# Patient Record
Sex: Male | Born: 1970 | Race: White | Hispanic: No | Marital: Married | Smoking: Never smoker
Health system: Southern US, Community
[De-identification: ages and names within clinical notes are randomized; demographics above are authoritative.]

## PROBLEM LIST (undated history)

## (undated) DIAGNOSIS — K209 Esophagitis, unspecified without bleeding: Secondary | ICD-10-CM

## (undated) DIAGNOSIS — Z947 Corneal transplant status: Secondary | ICD-10-CM

## (undated) DIAGNOSIS — S61432A Puncture wound without foreign body of left hand, initial encounter: Secondary | ICD-10-CM

## (undated) DIAGNOSIS — H4052X4 Glaucoma secondary to other eye disorders, left eye, indeterminate stage: Secondary | ICD-10-CM

## (undated) DIAGNOSIS — W3400XA Accidental discharge from unspecified firearms or gun, initial encounter: Secondary | ICD-10-CM

## (undated) DIAGNOSIS — B9681 Helicobacter pylori [H. pylori] as the cause of diseases classified elsewhere: Secondary | ICD-10-CM

## (undated) DIAGNOSIS — K2970 Gastritis, unspecified, without bleeding: Secondary | ICD-10-CM

## (undated) DIAGNOSIS — K259 Gastric ulcer, unspecified as acute or chronic, without hemorrhage or perforation: Secondary | ICD-10-CM

## (undated) HISTORY — PX: TRABECULECTOMY: SHX107

## (undated) HISTORY — PX: ELBOW SURGERY: SHX618

## (undated) HISTORY — PX: CATARACT EXTRACTION: SUR2

## (undated) HISTORY — PX: COLONOSCOPY: SHX174

## (undated) HISTORY — PX: OTHER SURGICAL HISTORY: SHX169

## (undated) HISTORY — PX: CORNEAL TRANSPLANT: SHX108

---

## 2004-11-25 ENCOUNTER — Ambulatory Visit: Payer: Self-pay | Admitting: Unknown Physician Specialty

## 2004-12-02 ENCOUNTER — Ambulatory Visit: Payer: Self-pay | Admitting: Unknown Physician Specialty

## 2004-12-14 ENCOUNTER — Emergency Department: Payer: Self-pay | Admitting: Emergency Medicine

## 2006-07-08 ENCOUNTER — Encounter: Admission: RE | Admit: 2006-07-08 | Discharge: 2006-07-08 | Payer: Self-pay | Admitting: Specialist

## 2007-07-02 ENCOUNTER — Emergency Department: Payer: Self-pay | Admitting: Internal Medicine

## 2007-07-03 ENCOUNTER — Ambulatory Visit: Payer: Self-pay | Admitting: Internal Medicine

## 2007-07-04 ENCOUNTER — Ambulatory Visit: Payer: Self-pay | Admitting: Internal Medicine

## 2007-07-14 ENCOUNTER — Ambulatory Visit: Payer: Self-pay | Admitting: Gastroenterology

## 2007-07-29 ENCOUNTER — Emergency Department (HOSPITAL_COMMUNITY): Admission: EM | Admit: 2007-07-29 | Discharge: 2007-07-29 | Payer: Self-pay | Admitting: Emergency Medicine

## 2007-08-07 ENCOUNTER — Encounter: Admission: RE | Admit: 2007-08-07 | Discharge: 2007-08-07 | Payer: Self-pay | Admitting: Specialist

## 2007-10-26 ENCOUNTER — Emergency Department (HOSPITAL_COMMUNITY): Admission: EM | Admit: 2007-10-26 | Discharge: 2007-10-26 | Payer: Self-pay | Admitting: Emergency Medicine

## 2008-08-11 ENCOUNTER — Ambulatory Visit: Payer: Self-pay | Admitting: Family Medicine
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
## 2011-01-09 ENCOUNTER — Emergency Department (HOSPITAL_COMMUNITY)
Admission: EM | Admit: 2011-01-09 | Discharge: 2011-01-09 | Disposition: A | Discharge status: Home / Self Care | Payer: 59 | Attending: Emergency Medicine | Admitting: Emergency Medicine

## 2011-01-09 ENCOUNTER — Emergency Department (HOSPITAL_COMMUNITY): Payer: 59

## 2011-01-09 DIAGNOSIS — S93409A Sprain of unspecified ligament of unspecified ankle, initial encounter: Secondary | ICD-10-CM | POA: Insufficient documentation

## 2011-01-09 DIAGNOSIS — S81009A Unspecified open wound, unspecified knee, initial encounter: Secondary | ICD-10-CM | POA: Insufficient documentation

## 2011-01-09 DIAGNOSIS — S81809A Unspecified open wound, unspecified lower leg, initial encounter: Secondary | ICD-10-CM | POA: Insufficient documentation

## 2011-01-09 DIAGNOSIS — Z23 Encounter for immunization: Secondary | ICD-10-CM | POA: Insufficient documentation

## 2011-01-09 DIAGNOSIS — M7989 Other specified soft tissue disorders: Secondary | ICD-10-CM | POA: Insufficient documentation

## 2011-01-09 DIAGNOSIS — W1789XA Other fall from one level to another, initial encounter: Secondary | ICD-10-CM | POA: Insufficient documentation

## 2011-01-09 DIAGNOSIS — M79609 Pain in unspecified limb: Secondary | ICD-10-CM | POA: Insufficient documentation

## 2011-01-09 DIAGNOSIS — M25579 Pain in unspecified ankle and joints of unspecified foot: Secondary | ICD-10-CM | POA: Insufficient documentation

## 2011-01-09 LAB — POCT I-STAT, CHEM 8
BUN: 17 mg/dL (ref 6–23)
Creatinine, Ser: 1.2 mg/dL (ref 0.4–1.5)
Glucose, Bld: 107 mg/dL — ABNORMAL HIGH (ref 70–99)
Hemoglobin: 16 g/dL (ref 13.0–17.0)
Sodium: 138 mEq/L (ref 135–145)
TCO2: 23 mmol/L (ref 0–100)

## 2011-01-09 LAB — CBC
HCT: 43.1 % (ref 39.0–52.0)
Hemoglobin: 14.8 g/dL (ref 13.0–17.0)
MCH: 32.3 pg (ref 26.0–34.0)
MCHC: 34.3 g/dL (ref 30.0–36.0)
MCV: 94.1 fL (ref 78.0–100.0)
RBC: 4.58 MIL/uL (ref 4.22–5.81)

## 2011-01-09 LAB — DIFFERENTIAL
Lymphocytes Relative: 14 % (ref 12–46)
Lymphs Abs: 1.4 10*3/uL (ref 0.7–4.0)
Monocytes Absolute: 0.8 10*3/uL (ref 0.1–1.0)
Monocytes Relative: 8 % (ref 3–12)
Neutro Abs: 7.7 10*3/uL (ref 1.7–7.7)
Neutrophils Relative %: 78 % — ABNORMAL HIGH (ref 43–77)

## 2011-01-12 ENCOUNTER — Inpatient Hospital Stay (HOSPITAL_COMMUNITY)
Admission: AD | Admit: 2011-01-12 | Discharge: 2011-01-14 | DRG: 563 | Disposition: A | Discharge status: Home / Self Care | Payer: 59 | Source: Ambulatory Visit | Attending: Orthopedic Surgery | Admitting: Orthopedic Surgery

## 2011-01-12 DIAGNOSIS — M7989 Other specified soft tissue disorders: Secondary | ICD-10-CM | POA: Diagnosis present

## 2011-01-12 DIAGNOSIS — Y92009 Unspecified place in unspecified non-institutional (private) residence as the place of occurrence of the external cause: Secondary | ICD-10-CM

## 2011-01-12 DIAGNOSIS — S96819A Strain of other specified muscles and tendons at ankle and foot level, unspecified foot, initial encounter: Principal | ICD-10-CM | POA: Diagnosis present

## 2011-01-12 DIAGNOSIS — S93499A Sprain of other ligament of unspecified ankle, initial encounter: Principal | ICD-10-CM | POA: Diagnosis present

## 2011-01-12 DIAGNOSIS — T798XXA Other early complications of trauma, initial encounter: Secondary | ICD-10-CM | POA: Diagnosis present

## 2011-01-12 DIAGNOSIS — X58XXXA Exposure to other specified factors, initial encounter: Secondary | ICD-10-CM

## 2011-01-12 LAB — BASIC METABOLIC PANEL
BUN: 16 mg/dL (ref 6–23)
CO2: 25 mEq/L (ref 19–32)
Calcium: 8.4 mg/dL (ref 8.4–10.5)
Chloride: 103 mEq/L (ref 96–112)
Creatinine, Ser: 0.93 mg/dL (ref 0.4–1.5)
GFR calc Af Amer: >60 mL/min (ref >60)

## 2011-01-12 LAB — CBC
Hemoglobin: 13.3 g/dL (ref 13.0–17.0)
MCH: 31.4 pg (ref 26.0–34.0)
MCHC: 33.2 g/dL (ref 30.0–36.0)
MCV: 94.6 fL (ref 78.0–100.0)
Platelets: 191 10*3/uL (ref 150–400)
RBC: 4.24 MIL/uL (ref 4.22–5.81)

## 2011-01-13 ENCOUNTER — Inpatient Hospital Stay (HOSPITAL_COMMUNITY): Payer: 59

## 2011-01-14 LAB — CBC
MCH: 31.3 pg (ref 26.0–34.0)
MCV: 95.3 fL (ref 78.0–100.0)
Platelets: 210 10*3/uL (ref 150–400)
RBC: 3.86 MIL/uL — ABNORMAL LOW (ref 4.22–5.81)
RDW: 13.3 % (ref 11.5–15.5)

## 2011-01-14 LAB — BASIC METABOLIC PANEL
BUN: 16 mg/dL (ref 6–23)
Calcium: 8.7 mg/dL (ref 8.4–10.5)
Creatinine, Ser: 1.03 mg/dL (ref 0.4–1.5)
GFR calc Af Amer: >60 mL/min (ref >60)
GFR calc non Af Amer: >60 mL/min (ref >60)
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
## 2012-05-16 DIAGNOSIS — R490 Dysphonia: Secondary | ICD-10-CM | POA: Insufficient documentation

## 2012-05-31 DIAGNOSIS — H4050X0 Glaucoma secondary to other eye disorders, unspecified eye, stage unspecified: Secondary | ICD-10-CM | POA: Insufficient documentation

## 2012-05-31 DIAGNOSIS — Z947 Corneal transplant status: Secondary | ICD-10-CM | POA: Insufficient documentation

## 2014-06-11 DIAGNOSIS — N400 Enlarged prostate without lower urinary tract symptoms: Secondary | ICD-10-CM | POA: Insufficient documentation

## 2014-06-11 DIAGNOSIS — N411 Chronic prostatitis: Secondary | ICD-10-CM | POA: Insufficient documentation

## 2014-06-11 DIAGNOSIS — E291 Testicular hypofunction: Secondary | ICD-10-CM | POA: Insufficient documentation

## 2015-12-30 DIAGNOSIS — Z79899 Other long term (current) drug therapy: Secondary | ICD-10-CM | POA: Insufficient documentation

## 2016-02-18 ENCOUNTER — Emergency Department
Admission: EM | Admit: 2016-02-18 | Discharge: 2016-02-18 | Disposition: A | Discharge status: Home / Self Care | Payer: BLUE CROSS/BLUE SHIELD | Attending: Emergency Medicine | Admitting: Emergency Medicine

## 2016-02-18 ENCOUNTER — Emergency Department: Payer: BLUE CROSS/BLUE SHIELD

## 2016-02-18 ENCOUNTER — Encounter: Payer: Self-pay | Admitting: Emergency Medicine

## 2016-02-18 DIAGNOSIS — Y9389 Activity, other specified: Secondary | ICD-10-CM | POA: Insufficient documentation

## 2016-02-18 DIAGNOSIS — W3400XA Accidental discharge from unspecified firearms or gun, initial encounter: Secondary | ICD-10-CM | POA: Diagnosis not present

## 2016-02-18 DIAGNOSIS — R52 Pain, unspecified: Secondary | ICD-10-CM

## 2016-02-18 DIAGNOSIS — Y929 Unspecified place or not applicable: Secondary | ICD-10-CM | POA: Diagnosis not present

## 2016-02-18 DIAGNOSIS — S61402A Unspecified open wound of left hand, initial encounter: Secondary | ICD-10-CM | POA: Diagnosis present

## 2016-02-18 DIAGNOSIS — Y999 Unspecified external cause status: Secondary | ICD-10-CM | POA: Diagnosis not present

## 2016-02-18 MED ORDER — OXYCODONE-ACETAMINOPHEN 5-325 MG PO TABS
ORAL_TABLET | ORAL | Status: AC
  Administered 2016-02-18: 2 via ORAL
  Filled 2016-02-18: qty 2

## 2016-02-18 MED ORDER — HYDROMORPHONE HCL 1 MG/ML IJ SOLN
1.0000 mg | Freq: Once | INTRAMUSCULAR | Status: AC
  Administered 2016-02-18: 1 mg via INTRAVENOUS
  Filled 2016-02-18: qty 1

## 2016-02-18 MED ORDER — CEPHALEXIN 500 MG PO CAPS
500.0000 mg | ORAL_CAPSULE | Freq: Once | ORAL | Status: AC
  Administered 2016-02-18: 500 mg via ORAL
  Filled 2016-02-18: qty 1

## 2016-02-18 MED ORDER — LIDOCAINE-EPINEPHRINE (PF) 1 %-1:200000 IJ SOLN
30.0000 mL | Freq: Once | INTRAMUSCULAR | Status: AC
  Administered 2016-02-18: 30 mL
  Filled 2016-02-18: qty 30

## 2016-02-18 MED ORDER — MORPHINE SULFATE (PF) 4 MG/ML IV SOLN
4.0000 mg | Freq: Once | INTRAVENOUS | Status: AC
  Administered 2016-02-18: 4 mg via INTRAMUSCULAR

## 2016-02-18 MED ORDER — OXYCODONE-ACETAMINOPHEN 5-325 MG PO TABS
2.0000 | ORAL_TABLET | Freq: Once | ORAL | Status: AC
  Administered 2016-02-18: 2 via ORAL

## 2016-02-18 MED ORDER — MORPHINE SULFATE (PF) 4 MG/ML IV SOLN
INTRAVENOUS | Status: AC
  Administered 2016-02-18: 4 mg via INTRAMUSCULAR
  Filled 2016-02-18: qty 1

## 2016-02-18 MED ORDER — LIDOCAINE-EPINEPHRINE-TETRACAINE (LET) SOLUTION
NASAL | Status: AC
  Administered 2016-02-18: 3 mL via TOPICAL
  Filled 2016-02-18: qty 3

## 2016-02-18 MED ORDER — LORAZEPAM 2 MG/ML IJ SOLN: 1.0000 mg | Freq: Once | INTRAMUSCULAR | Status: DC

## 2016-02-18 MED ORDER — LIDOCAINE-EPINEPHRINE-TETRACAINE (LET) SOLUTION
3.0000 mL | Freq: Once | NASAL | Status: AC
  Administered 2016-02-18: 3 mL via TOPICAL

## 2016-02-18 MED ORDER — KETOROLAC TROMETHAMINE 30 MG/ML IJ SOLN
30.0000 mg | Freq: Once | INTRAMUSCULAR | Status: AC
  Administered 2016-02-18: 30 mg via INTRAVENOUS
  Filled 2016-02-18: qty 1

## 2016-02-18 MED ORDER — CEPHALEXIN 500 MG PO CAPS
500.0000 mg | ORAL_CAPSULE | Freq: Three times a day (TID) | ORAL | Status: DC
Start: 2016-02-18 — End: 2016-07-06

## 2016-02-18 MED ORDER — OXYCODONE-ACETAMINOPHEN 5-325 MG PO TABS: 1.0000 | ORAL_TABLET | Freq: Four times a day (QID) | ORAL | Status: DC | PRN
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
## 2016-05-19 DIAGNOSIS — J383 Other diseases of vocal cords: Secondary | ICD-10-CM | POA: Insufficient documentation

## 2016-07-06 ENCOUNTER — Encounter (HOSPITAL_COMMUNITY): Payer: Self-pay | Admitting: Emergency Medicine

## 2016-07-06 ENCOUNTER — Emergency Department (HOSPITAL_COMMUNITY): Payer: BLUE CROSS/BLUE SHIELD

## 2016-07-06 ENCOUNTER — Emergency Department (HOSPITAL_COMMUNITY)
Admission: EM | Admit: 2016-07-06 | Discharge: 2016-07-06 | Disposition: A | Discharge status: Home / Self Care | Payer: BLUE CROSS/BLUE SHIELD | Attending: Emergency Medicine | Admitting: Emergency Medicine

## 2016-07-06 DIAGNOSIS — R1011 Right upper quadrant pain: Secondary | ICD-10-CM | POA: Diagnosis present

## 2016-07-06 LAB — URINALYSIS, ROUTINE W REFLEX MICROSCOPIC
BILIRUBIN URINE: NEGATIVE
GLUCOSE, UA: NEGATIVE mg/dL
HGB URINE DIPSTICK: NEGATIVE
Ketones, ur: NEGATIVE mg/dL
Leukocytes, UA: NEGATIVE
Nitrite: NEGATIVE
PH: 6 (ref 5.0–8.0)
Protein, ur: NEGATIVE mg/dL
SPECIFIC GRAVITY, URINE: 1.026 (ref 1.005–1.030)

## 2016-07-06 LAB — COMPREHENSIVE METABOLIC PANEL
ALT: 46 U/L (ref 17–63)
AST: 32 U/L (ref 15–41)
Albumin: 3.9 g/dL (ref 3.5–5.0)
Alkaline Phosphatase: 37 U/L — ABNORMAL LOW (ref 38–126)
Anion gap: 10 (ref 5–15)
BUN: 20 mg/dL (ref 6–20)
CHLORIDE: 104 mmol/L (ref 101–111)
CO2: 26 mmol/L (ref 22–32)
CREATININE: 1.2 mg/dL (ref 0.61–1.24)
Calcium: 9.5 mg/dL (ref 8.9–10.3)
GFR calc Af Amer: >60 mL/min (ref >60)
GFR calc non Af Amer: >60 mL/min (ref >60)
Glucose, Bld: 96 mg/dL (ref 65–99)
Potassium: 3.6 mmol/L (ref 3.5–5.1)
SODIUM: 140 mmol/L (ref 135–145)
Total Bilirubin: 0.7 mg/dL (ref 0.3–1.2)
Total Protein: 7 g/dL (ref 6.5–8.1)

## 2016-07-06 LAB — CBC WITH DIFFERENTIAL/PLATELET
BASOS ABS: 0 10*3/uL (ref 0.0–0.1)
Basophils Relative: 0 %
EOS ABS: 0.1 10*3/uL (ref 0.0–0.7)
EOS PCT: 1 %
HCT: 45.8 % (ref 39.0–52.0)
HEMOGLOBIN: 16.2 g/dL (ref 13.0–17.0)
LYMPHS PCT: 38 %
Lymphs Abs: 2.6 10*3/uL (ref 0.7–4.0)
MCH: 33 pg (ref 26.0–34.0)
MCHC: 35.4 g/dL (ref 30.0–36.0)
MCV: 93.3 fL (ref 78.0–100.0)
Monocytes Absolute: 0.7 10*3/uL (ref 0.1–1.0)
Monocytes Relative: 11 %
NEUTROS PCT: 50 %
Neutro Abs: 3.4 10*3/uL (ref 1.7–7.7)
PLATELETS: 146 10*3/uL — AB (ref 150–400)
RBC: 4.91 MIL/uL (ref 4.22–5.81)
RDW: 13.9 % (ref 11.5–15.5)
WBC: 6.9 10*3/uL (ref 4.0–10.5)

## 2016-07-06 LAB — LIPASE, BLOOD: Lipase: 46 U/L (ref 11–51)

## 2016-07-06 MED ORDER — SUCRALFATE 1 GM/10ML PO SUSP: 1.0000 g | Freq: Three times a day (TID) | ORAL | 0 refills | Status: DC

## 2016-07-06 MED ORDER — ONDANSETRON HCL 4 MG/2ML IJ SOLN
4.0000 mg | Freq: Once | INTRAMUSCULAR | Status: AC
  Administered 2016-07-06: 4 mg via INTRAVENOUS
  Filled 2016-07-06: qty 2

## 2016-07-06 MED ORDER — PANTOPRAZOLE SODIUM 20 MG PO TBEC: 20.0000 mg | DELAYED_RELEASE_TABLET | Freq: Two times a day (BID) | ORAL | 0 refills | Status: DC

## 2016-07-06 MED ORDER — SODIUM CHLORIDE 0.9 % IV BOLUS (SEPSIS)
1000.0000 mL | Freq: Once | INTRAVENOUS | Status: AC
  Administered 2016-07-06: 1000 mL via INTRAVENOUS

## 2016-07-06 MED ORDER — AMOXICILLIN 500 MG PO TABS: 1000.0000 mg | ORAL_TABLET | Freq: Two times a day (BID) | ORAL | 0 refills | Status: DC

## 2016-07-06 MED ORDER — GI COCKTAIL ~~LOC~~
30.0000 mL | Freq: Once | ORAL | Status: AC
  Administered 2016-07-06: 30 mL via ORAL
  Filled 2016-07-06: qty 30

## 2016-07-06 MED ORDER — POLYETHYLENE GLYCOL 3350 17 G PO PACK: 17.0000 g | PACK | Freq: Every day | ORAL | 0 refills | Status: DC

## 2016-07-06 MED ORDER — HYDROCODONE-ACETAMINOPHEN 5-325 MG PO TABS: 2.0000 | ORAL_TABLET | ORAL | 0 refills | Status: DC | PRN

## 2016-07-06 MED ORDER — CLARITHROMYCIN 500 MG PO TABS: 500.0000 mg | ORAL_TABLET | Freq: Two times a day (BID) | ORAL | 0 refills | Status: DC

## 2016-07-06 MED ORDER — HYDROMORPHONE HCL 2 MG/ML IJ SOLN
1.0000 mg | Freq: Once | INTRAMUSCULAR | Status: AC
  Administered 2016-07-06: 1 mg via INTRAVENOUS
  Filled 2016-07-06: qty 1

## 2016-07-06 NOTE — ED Provider Notes (Signed)
[facility]-EMERGENCY DEPT Provider Note   CSN: [id number] Arrival date & time: 07/06/16  0400     History   Chief Complaint Chief Complaint  Patient presents with  . Abdominal Pain    HPI Eddie Clark is a 45 y.o. male.  Patient presents with complaints of abdominal pain. Reports the pain began 4 or 5 days ago and has progressively worsened. Pain is primarily around the right upper abdomen, but has become more diffuse over time. No associated vomiting or diarrhea. He reports a history of duodenal ulcer secondary to H. pylori, however, reports that this pain seems to be worse. Pain is severe and constant, no alleviating or exacerbating factors.      History reviewed. No pertinent past medical history.  There are no active problems to display for this patient.   History reviewed. No pertinent surgical history.     Home Medications    Prior to Admission medications   Medication Sig Start Date End Date Taking? Authorizing Provider  clomiPHENE (CLOMID) 50 MG tablet Take 25 mg by mouth daily.   Yes Historical Provider, MD  esomeprazole (NEXIUM) 40 MG capsule Take 40 mg by mouth daily at 12 noon.   Yes Historical Provider, MD  naproxen sodium (ANAPROX) 220 MG tablet Take 220 mg by mouth 2 (two) times daily as needed (pain).   Yes Historical Provider, MD  prednisoLONE acetate (PRED FORTE) 1 % ophthalmic suspension Place 1 drop into the left eye daily.   Yes Historical Provider, MD    Family History No family history on file.  Social History Social History  Substance Use Topics  . Smoking status: Never Smoker  . Smokeless tobacco: Never Used  . Alcohol use Not on file     Allergies   Shellfish allergy   Review of Systems Review of Systems  Gastrointestinal: Positive for abdominal pain.  All other systems reviewed and are negative.    Physical Exam Updated Vital Signs BP 104/69   Pulse 66   Temp 98 F (36.7 C) (Oral)   Resp 18   Ht 5\' 9"  (1.753 m)   Wt 275  lb (124.7 kg)   SpO2 96%   BMI 40.61 kg/m   Physical Exam  Constitutional: He is oriented to person, place, and time. He appears well-developed and well-nourished. No distress.  HENT:  Head: Normocephalic and atraumatic.  Right Ear: Hearing normal.  Left Ear: Hearing normal.  Nose: Nose normal.  Mouth/Throat: Oropharynx is clear and moist and mucous membranes are normal.  Eyes: Conjunctivae and EOM are normal. Pupils are equal, round, and reactive to light.  Neck: Normal range of motion. Neck supple.  Cardiovascular: Regular rhythm, S1 normal and S2 normal.  Exam reveals no gallop and no friction rub.   No murmur heard. Pulmonary/Chest: Effort normal and breath sounds normal. No respiratory distress. He exhibits no tenderness.  Abdominal: Soft. Normal appearance and bowel sounds are normal. There is no hepatosplenomegaly. There is tenderness in the right upper quadrant. There is no rebound, no guarding, no tenderness at McBurney's point and negative Murphy's sign. No hernia.  Musculoskeletal: Normal range of motion.  Neurological: He is alert and oriented to person, place, and time. He has normal strength. No cranial nerve deficit or sensory deficit. Coordination normal. GCS eye subscore is 4. GCS verbal subscore is 5. GCS motor subscore is 6.  Skin: Skin is warm, dry and intact. No rash noted. No cyanosis.  Psychiatric: He has a normal mood and affect. His  speech is normal and behavior is normal. Thought content normal.  Nursing note and vitals reviewed.    ED Treatments / Results  Labs (all labs ordered are listed, but only abnormal results are displayed) Labs Reviewed  CBC WITH DIFFERENTIAL/PLATELET - Abnormal; Notable for the following:       Result Value   Platelets 146 (*)    All other components within normal limits  COMPREHENSIVE METABOLIC PANEL - Abnormal; Notable for the following:    Alkaline Phosphatase 37 (*)    All other components within normal limits  URINALYSIS,  ROUTINE W REFLEX MICROSCOPIC (NOT AT [facility])  LIPASE, BLOOD  H. PYLORI ANTIBODY, IGG    EKG  EKG Interpretation None       Radiology Koreas Abdomen Limited Ruq  Result Date: 07/06/2016 CLINICAL DATA:  Sharp upper abdominal pain EXAM: US ABDOMEN LIMITED - RIGHT UPPER QUADRANT COMPARISON:  None. FINDINGS: Gallbladder: No gallstones or wall thickening visualized. No sonographic Murphy sign noted by sonographer. Common bile duct: Diameter: 3 mm Liver: Increased hepatic echogenicity eat, consistent with fatty infiltration. IMPRESSION: 1. No acute abnormality of the right upper quadrant. No evidence of cholecystitis. 2. Hyperechoic hepatic echotexture, suggesting hepatic steatosis. Electronically Signed   By: Deatra RobinsonKevin  Herman M.D.   On: 07/06/2016 05:06    Procedures Procedures (including critical care time)  Medications Ordered in ED Medications  gi cocktail (Maalox,Lidocaine,Donnatal) (not administered)  HYDROmorphone (DILAUDID) injection 1 mg (not administered)  sodium chloride 0.9 % bolus 1,000 mL (1,000 mLs Intravenous New Bag/Given 07/06/16 0434)  ondansetron (ZOFRAN) injection 4 mg (4 mg Intravenous Given 07/06/16 0432)  HYDROmorphone (DILAUDID) injection 1 mg (1 mg Intravenous Given 07/06/16 0432)     Initial Impression / Assessment and Plan / ED Course  I have reviewed the triage vital signs and the nursing notes.  Pertinent labs & imaging results that were available during my care of the patient were reviewed by me and considered in my medical decision making (see chart for details).  Clinical Course    Patient presents with complaints of upper abdominal pain, predominantly right-sided. Examination did reveal right upper quadrant tenderness but there is no obvious Murphy sign. No guarding or rebound. Left upper quadrant, left lower quadrant, right lower quadrant unremarkable and nontender. Lab work is normal. Patient underwent right upper quadrant ultrasound and there is no acute  abnormality noted.  Symptoms are consistent with possible recurrence of duodenal ulcer. Will treat empirically for H. pylori, symptomatic treatment.  Final Clinical Impressions(s) / ED Diagnoses   Final diagnoses:  Right upper quadrant abdominal pain    New Prescriptions New Prescriptions   No medications on file     Gilda Creasehristopher J Pollina, MD 07/06/16 [phone]

## 2016-07-06 NOTE — ED Triage Notes (Addendum)
Pt in from home with non-radiating sharp, stabbing R sided abd pain x 5 days. Pt denies n/v/d, sob or cp. States he had shrimp alfredo accidentally 5 days ago, has shellfish allergy, had vomitting but no other effects. States he's had normal BM's, abd distention present. Hx of duodenal ulcer

## 2016-07-06 NOTE — ED Notes (Signed)
Pt taken to xray 

## 2016-07-06 NOTE — ED Notes (Signed)
Pt taken to US

## 2016-07-07 LAB — H. PYLORI ANTIBODY, IGG

## 2016-07-09 ENCOUNTER — Encounter: Payer: Self-pay | Admitting: *Deleted

## 2016-07-09 ENCOUNTER — Ambulatory Visit
Admission: RE | Admit: 2016-07-09 | Discharge: 2016-07-09 | Disposition: A | Discharge status: Home / Self Care | Payer: BLUE CROSS/BLUE SHIELD | Source: Ambulatory Visit | Attending: Gastroenterology | Admitting: Gastroenterology

## 2016-07-09 ENCOUNTER — Encounter
Admission: RE | Disposition: A | Discharge status: Home / Self Care | Payer: Self-pay | Source: Ambulatory Visit | Attending: Gastroenterology

## 2016-07-09 ENCOUNTER — Ambulatory Visit: Payer: BLUE CROSS/BLUE SHIELD | Admitting: Anesthesiology

## 2016-07-09 DIAGNOSIS — Z79899 Other long term (current) drug therapy: Secondary | ICD-10-CM | POA: Diagnosis not present

## 2016-07-09 DIAGNOSIS — K2950 Unspecified chronic gastritis without bleeding: Secondary | ICD-10-CM | POA: Insufficient documentation

## 2016-07-09 DIAGNOSIS — R1011 Right upper quadrant pain: Secondary | ICD-10-CM | POA: Diagnosis not present

## 2016-07-09 DIAGNOSIS — K210 Gastro-esophageal reflux disease with esophagitis: Secondary | ICD-10-CM | POA: Insufficient documentation

## 2016-07-09 DIAGNOSIS — Z8719 Personal history of other diseases of the digestive system: Secondary | ICD-10-CM | POA: Insufficient documentation

## 2016-07-09 HISTORY — DX: Gastritis, unspecified, without bleeding: K29.70

## 2016-07-09 HISTORY — DX: Accidental discharge from unspecified firearms or gun, initial encounter: W34.00XA

## 2016-07-09 HISTORY — DX: Puncture wound without foreign body of left hand, initial encounter: S61.432A

## 2016-07-09 HISTORY — DX: Esophagitis, unspecified: K20.9

## 2016-07-09 HISTORY — DX: Esophagitis, unspecified without bleeding: K20.90

## 2016-07-09 HISTORY — PX: ESOPHAGOGASTRODUODENOSCOPY (EGD) WITH PROPOFOL: SHX5813

## 2016-07-09 HISTORY — DX: Helicobacter pylori (H. pylori) as the cause of diseases classified elsewhere: B96.81

## 2016-07-09 HISTORY — DX: Gastric ulcer, unspecified as acute or chronic, without hemorrhage or perforation: K25.9

## 2016-07-09 SURGERY — ESOPHAGOGASTRODUODENOSCOPY (EGD) WITH PROPOFOL
Anesthesia: General

## 2016-07-09 MED ORDER — SODIUM CHLORIDE 0.9 % IV SOLN: INTRAVENOUS | Status: DC

## 2016-07-09 MED ORDER — PROPOFOL 10 MG/ML IV BOLUS
INTRAVENOUS | Status: DC | PRN
  Administered 2016-07-09 (×2): 30 mg via INTRAVENOUS
  Administered 2016-07-09: 20 mg via INTRAVENOUS
  Administered 2016-07-09: 70 mg via INTRAVENOUS
  Administered 2016-07-09: 50 mg via INTRAVENOUS

## 2016-07-09 MED ORDER — SODIUM CHLORIDE 0.9 % IV SOLN
INTRAVENOUS | Status: DC
  Administered 2016-07-09: 15:00:00 via INTRAVENOUS
  Administered 2016-07-09: 1000 mL via INTRAVENOUS

## 2016-07-09 MED ORDER — MIDAZOLAM HCL 2 MG/2ML IJ SOLN
INTRAMUSCULAR | Status: DC | PRN
  Administered 2016-07-09: 2 mg via INTRAVENOUS

## 2016-07-09 MED ORDER — GLYCOPYRROLATE 0.2 MG/ML IJ SOLN
INTRAMUSCULAR | Status: DC | PRN
  Administered 2016-07-09: 0.2 mg via INTRAVENOUS

## 2016-07-09 MED ORDER — LIDOCAINE HCL (CARDIAC) 20 MG/ML IV SOLN
INTRAVENOUS | Status: DC | PRN
  Administered 2016-07-09: 60 mg via INTRAVENOUS

## 2016-07-09 NOTE — Anesthesia Postprocedure Evaluation (Signed)
Anesthesia Post Note  Patient: Eddie Clark  Procedure(s) Performed: Procedure(s) (LRB): ESOPHAGOGASTRODUODENOSCOPY (EGD) WITH PROPOFOL (N/A)  Patient location during evaluation: Endoscopy Anesthesia Type: General Level of consciousness: awake and alert Pain management: pain level controlled Vital Signs Assessment: post-procedure vital signs reviewed and stable Respiratory status: spontaneous breathing, nonlabored ventilation, respiratory function stable and patient connected to nasal cannula oxygen Cardiovascular status: blood pressure returned to baseline and stable Postop Assessment: no signs of nausea or vomiting Anesthetic complications: no    Last Vitals:  Vitals:   07/09/16 1552 07/09/16 1602  BP: 123/73 118/86  Pulse: 74 82  Resp: 16 16  Temp:      Last Pain:  Vitals:   07/09/16 1532  TempSrc: Tympanic  PainSc:                  Cleda MccreedyJoseph K Desiray Orchard

## 2016-07-09 NOTE — Anesthesia Preprocedure Evaluation (Signed)
Anesthesia Evaluation  Patient identified by MRN, date of birth, ID band Patient awake    Reviewed: Allergy & Precautions, NPO status , Patient's Chart, lab work & pertinent test results  History of Anesthesia Complications Negative for: history of anesthetic complications  Airway Mallampati: II       Dental   Pulmonary neg pulmonary ROS,   Pt with raspy voice 2ndary to vocal cord polyp removed several weeks ago.         Cardiovascular      Neuro/Psych negative neurological ROS     GI/Hepatic Neg liver ROS, PUD, GERD  Medicated and Controlled,  Endo/Other  negative endocrine ROS  Renal/GU negative Renal ROS     Musculoskeletal   Abdominal   Peds  Hematology negative hematology ROS (+)   Anesthesia Other Findings   Reproductive/Obstetrics                             Anesthesia Physical Anesthesia Plan  ASA: II  Anesthesia Plan: General   Post-op Pain Management:    Induction: Intravenous  Airway Management Planned: Nasal Cannula  Additional Equipment:   Intra-op Plan:   Post-operative Plan:   Informed Consent: I have reviewed the patients History and Physical, chart, labs and discussed the procedure including the risks, benefits and alternatives for the proposed anesthesia with the patient or authorized representative who has indicated his/her understanding and acceptance.     Plan Discussed with:   Anesthesia Plan Comments:         Anesthesia Quick Evaluation

## 2016-07-09 NOTE — Op Note (Signed)
[facility] Gastroenterology Patient Name: Albertine PatriciaJerry Gosse Procedure Date: 07/09/2016 3:04 PM MRN: [id number] Account #: [id number] Date of Birth: [birth year] Admit Type: Outpatient Age: [age] [facility] 3 Gender: Male Note Status: Finalized Procedure:            Upper GI endoscopy Indications:          Abdominal pain in the right upper quadrant Providers:            Christena DeemMartin U. Allysha Tryon, MD Referring MD:         Danella PentonMark F. Miller, MD (Referring MD) Medicines:            Monitored Anesthesia Care Complications:        No immediate complications. Procedure:            Pre-Anesthesia Assessment:                       - ASA Grade Assessment: II - A patient with mild                        systemic disease.                       After obtaining informed consent, the endoscope was                        passed under direct vision. Throughout the procedure,                        the patient's blood pressure, pulse, and oxygen                        saturations were monitored continuously. The Endoscope                        was introduced through the mouth, and advanced to the                        third part of duodenum. The upper GI endoscopy was                        accomplished without difficulty. Findings:      The Z-line was variable. Biopsies were taken with a cold forceps for       histology.      The exam of the esophagus was otherwise normal.      Patchy minimal inflammation characterized by erosions and erythema was       found in the gastric body and in the gastric antrum. Biopsies were taken       with a cold forceps for histology. Biopsies were taken with a cold       forceps for Helicobacter pylori testing.      The cardia and gastric fundus were normal on retroflexion.      The examined duodenum was normal. Impression:           - Z-line variable. Biopsied.                       - Erosive gastritis. Biopsied.                       - Normal examined  duodenum. Recommendation:       -  Discharge patient to home.                       - Await pathology results.                       - Continue present medications.                       - Perform a hepatobiliary scan at appointment to be                        scheduled. Procedure Code(s):    --- Professional ---                       [phone], Esophagogastroduodenoscopy, flexible, transoral;                        with biopsy, single or multiple Diagnosis Code(s):    --- Professional ---                       K22.8, Other specified diseases of esophagus                       K29.60, Other gastritis without bleeding                       R10.11, Right upper quadrant pain CPT copyright 2016 American Medical Association. All rights reserved. The codes documented in this report are preliminary and upon coder review may  be revised to meet current compliance requirements. Christena DeemMartin U Lucynda Rosano, MD 07/09/2016 3:32:10 PM This report has been signed electronically. Number of Addenda: 0 Note Initiated On: 07/09/2016 3:04 PM      [facility]
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
## 2016-07-09 NOTE — Transfer of Care (Signed)
Immediate Anesthesia Transfer of Care Note  Patient: Eddie Clark  Procedure(s) Performed: Procedure(s): ESOPHAGOGASTRODUODENOSCOPY (EGD) WITH PROPOFOL (N/A)  Patient Location: Endoscopy Unit  Anesthesia Type:General  Level of Consciousness: awake, oriented and patient cooperative  Airway & Oxygen Therapy: Patient Spontanous Breathing and Patient connected to nasal cannula oxygen  Post-op Assessment: Report given to RN, Post -op Vital signs reviewed and stable and Patient moving all extremities X 4  Post vital signs: Reviewed and stable  Last Vitals:  Vitals:   07/09/16 1346 07/09/16 1532  BP: 110/69   Pulse: 74   Resp: 18   Temp: 36.7 C (!) (P) 35.9 C    Last Pain:  Vitals:   07/09/16 1532  TempSrc: (P) Tympanic  PainSc:          Complications: No apparent anesthesia complications

## 2016-07-12 ENCOUNTER — Encounter
Admission: RE | Admit: 2016-07-12 | Discharge: 2016-07-12 | Disposition: A | Discharge status: Home / Self Care | Payer: BLUE CROSS/BLUE SHIELD | Source: Ambulatory Visit | Attending: Gastroenterology | Admitting: Gastroenterology

## 2016-07-12 ENCOUNTER — Other Ambulatory Visit: Payer: Self-pay | Admitting: Gastroenterology

## 2016-07-12 ENCOUNTER — Encounter: Payer: Self-pay | Admitting: Gastroenterology

## 2016-07-12 DIAGNOSIS — R1011 Right upper quadrant pain: Secondary | ICD-10-CM | POA: Insufficient documentation

## 2016-07-12 MED ORDER — TECHNETIUM TC 99M MEBROFENIN IV KIT
4.9660 | PACK | Freq: Once | INTRAVENOUS | Status: AC | PRN
  Administered 2016-07-12: 4.966 via INTRAVENOUS

## 2016-07-13 LAB — SURGICAL PATHOLOGY

## 2016-07-14 ENCOUNTER — Ambulatory Visit
Admission: RE | Admit: 2016-07-14 | Discharge: 2016-07-14 | Disposition: A | Discharge status: Home / Self Care | Payer: BLUE CROSS/BLUE SHIELD | Source: Ambulatory Visit | Attending: Gastroenterology | Admitting: Gastroenterology

## 2016-07-14 DIAGNOSIS — K760 Fatty (change of) liver, not elsewhere classified: Secondary | ICD-10-CM | POA: Insufficient documentation

## 2016-07-14 DIAGNOSIS — R1011 Right upper quadrant pain: Secondary | ICD-10-CM

## 2016-07-14 MED ORDER — IOPAMIDOL (ISOVUE-300) INJECTION 61%
100.0000 mL | Freq: Once | INTRAVENOUS | Status: AC | PRN
  Administered 2016-07-14: 100 mL via INTRAVENOUS

## 2016-09-08 DIAGNOSIS — N50819 Testicular pain, unspecified: Secondary | ICD-10-CM | POA: Insufficient documentation

## 2018-08-17 ENCOUNTER — Encounter: Payer: Self-pay | Admitting: Urology

## 2018-08-17 ENCOUNTER — Ambulatory Visit (INDEPENDENT_AMBULATORY_CARE_PROVIDER_SITE_OTHER): Payer: BLUE CROSS/BLUE SHIELD | Admitting: Urology

## 2018-08-17 VITALS — BP 172/83 | HR 97 | Ht 74.0 in | Wt 257.4 lb

## 2018-08-17 DIAGNOSIS — E291 Testicular hypofunction: Secondary | ICD-10-CM

## 2018-08-17 MED ORDER — CLOMIPHENE CITRATE 50 MG PO TABS: 25.0000 mg | ORAL_TABLET | Freq: Every day | ORAL | 2 refills | Status: DC
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
## 2018-08-18 ENCOUNTER — Telehealth: Payer: Self-pay | Admitting: Urology

## 2018-08-18 LAB — TESTOSTERONE: Testosterone: 225 ng/dL — ABNORMAL LOW (ref 264–916)

## 2018-08-18 LAB — HEMATOCRIT: Hematocrit: 47.2 % (ref 37.5–51.0)

## 2018-08-18 NOTE — Telephone Encounter (Signed)
Spoke with patient gave results  Set patient up on mychart   michelle

## 2018-08-18 NOTE — Telephone Encounter (Signed)
-----   Message from Riki Altes, MD sent at 08/18/2018  8:23 AM EST ----- Testosterone level was low at 225.  Hematocrit was normal.

## 2018-10-10 ENCOUNTER — Telehealth: Payer: Self-pay

## 2018-10-10 NOTE — Telephone Encounter (Signed)
Faxed PA with office notes for Clomid

## 2018-10-11 ENCOUNTER — Telehealth: Payer: Self-pay

## 2018-10-11 NOTE — Telephone Encounter (Signed)
Received notice from PA that patient does not have drug coverage. Called and spoke with patient.  He has a different drug coverage but his wife currently has the card. He will call back with information.

## 2018-10-13 ENCOUNTER — Telehealth: Payer: Self-pay | Admitting: Urology

## 2018-10-13 NOTE — Telephone Encounter (Signed)
Pt called office and e-mailed RX Drug Card over.  I scanned into patient's chart and gave copy to [facility] for Clomid.

## 2018-10-13 NOTE — Telephone Encounter (Signed)
New insurance information sent to Plan for PA for Clomid

## 2018-10-20 ENCOUNTER — Telehealth: Payer: Self-pay

## 2018-10-20 NOTE — Telephone Encounter (Signed)
PA for Clomid resubmitted with new insurance information

## 2018-10-26 MED ORDER — CLOMIPHENE CITRATE 50 MG PO TABS: 25.0000 mg | ORAL_TABLET | Freq: Every day | ORAL | 2 refills | Status: DC

## 2018-10-26 NOTE — Telephone Encounter (Signed)
Patient returned call. Advised patient of  PA denial for Clomid. He stated his pharmacy can get him a good discount as they have done before in the past. No further action needed

## 2018-10-26 NOTE — Addendum Note (Signed)
Addended by: Anne Fu on: 10/26/2018 09:45 AM   Modules accepted: Orders

## 2018-10-26 NOTE — Telephone Encounter (Signed)
PA for Clomid denied again. Called and left message for patient to return call. Will offer patient GoodRx coupon in lieu of denial.

## 2018-12-14 ENCOUNTER — Telehealth: Payer: Self-pay | Admitting: Urology

## 2018-12-14 MED ORDER — CLOMIPHENE CITRATE 50 MG PO TABS: 25.0000 mg | ORAL_TABLET | Freq: Every day | ORAL | 1 refills | Status: DC

## 2018-12-14 NOTE — Telephone Encounter (Signed)
Pt states he will run out of Rx the end of May.  He will be here for Labs on 02/15/19. He does not have an office visit until 08/2019.

## 2018-12-14 NOTE — Telephone Encounter (Signed)
rx sent

## 2018-12-14 NOTE — Telephone Encounter (Signed)
Ok to refill 

## 2018-12-26 ENCOUNTER — Telehealth: Payer: Self-pay | Admitting: Urology

## 2018-12-26 NOTE — Telephone Encounter (Signed)
error 

## 2018-12-26 NOTE — Telephone Encounter (Signed)
Prior authorization required for Clomiphene (Clomid).  Cover My Meds Key: [id number] Patient Last Name: [facility] DOB: 20-Aug-1970

## 2018-12-27 NOTE — Telephone Encounter (Signed)
Prior Authorization was denied on 10/26/2018. Per katie's note he is paying for the medication out of pocket from his pharmacy. No PA is needed at this time.

## 2019-02-15 ENCOUNTER — Other Ambulatory Visit: Payer: BC Managed Care – PPO

## 2019-02-15 ENCOUNTER — Other Ambulatory Visit: Payer: Self-pay

## 2019-02-15 DIAGNOSIS — E291 Testicular hypofunction: Secondary | ICD-10-CM

## 2019-02-16 ENCOUNTER — Telehealth: Payer: Self-pay

## 2019-02-16 LAB — HEMATOCRIT: Hematocrit: 48.6 % (ref 37.5–51.0)

## 2019-02-16 LAB — PSA: Prostate Specific Ag, Serum: 0.4 ng/mL (ref 0.0–4.0)

## 2019-02-16 LAB — TESTOSTERONE: Testosterone: 254 ng/dL — ABNORMAL LOW (ref 264–916)

## 2019-02-16 NOTE — Telephone Encounter (Signed)
-----   Message from [name], MD sent at 02/16/2019 11:17 AM EDT ----- T level was 254; HCT and PSA stable.  If no bothersome sxs f/u as scheduled

## 2019-02-16 NOTE — Telephone Encounter (Signed)
Left message for patient to return call for results.

## 2019-02-16 NOTE — Telephone Encounter (Signed)
Advised patient of results. Patient verbalized satisfaction and understanding.

## 2019-02-19 NOTE — Telephone Encounter (Signed)
Patient states he does not want to start Testosterone. OK with Clomid but wants to know if Dr Bernardo Heater would consider increasing his Clomid dosing. Will ask provider and advise patient accordingly.

## 2019-02-19 NOTE — Telephone Encounter (Signed)
Testosterone booster supplements are generally not effective and significantly increasing levels.  We could look at stopping the Clomid and changing to testosterone if desired.

## 2019-02-20 MED ORDER — CLOMIPHENE CITRATE 50 MG PO TABS: 50.0000 mg | ORAL_TABLET | Freq: Every day | ORAL | 1 refills | Status: DC

## 2019-02-20 NOTE — Telephone Encounter (Signed)
He can increase to 1 tablet daily.  Repeat testosterone level in 6 weeks.  Rx sent

## 2019-02-20 NOTE — Telephone Encounter (Signed)
Called patient to relay message from Dr Bernardo Heater. Patient states that he is not going to make a medication change at this time as he was just diagnosed with COVID-19. He will contact the office when things settle down for him if he decides to increase his dosage.

## 2019-03-15 ENCOUNTER — Other Ambulatory Visit: Payer: Self-pay

## 2019-03-15 ENCOUNTER — Other Ambulatory Visit: Payer: Self-pay | Admitting: Gastroenterology

## 2019-03-15 ENCOUNTER — Ambulatory Visit
Admission: RE | Admit: 2019-03-15 | Discharge: 2019-03-15 | Disposition: A | Discharge status: Home / Self Care | Payer: Self-pay | Attending: Gastroenterology | Admitting: Gastroenterology

## 2019-03-15 ENCOUNTER — Ambulatory Visit
Admission: RE | Admit: 2019-03-15 | Discharge: 2019-03-15 | Disposition: A | Discharge status: Home / Self Care | Payer: Self-pay | Source: Ambulatory Visit | Attending: Gastroenterology | Admitting: Gastroenterology

## 2019-03-15 DIAGNOSIS — R194 Change in bowel habit: Secondary | ICD-10-CM

## 2019-03-15 DIAGNOSIS — R1010 Upper abdominal pain, unspecified: Secondary | ICD-10-CM

## 2019-08-22 ENCOUNTER — Ambulatory Visit (INDEPENDENT_AMBULATORY_CARE_PROVIDER_SITE_OTHER): Payer: BC Managed Care – PPO | Admitting: Urology

## 2019-08-22 ENCOUNTER — Encounter: Payer: Self-pay | Admitting: Urology

## 2019-08-22 ENCOUNTER — Other Ambulatory Visit: Payer: Self-pay

## 2019-08-22 VITALS — BP 147/77 | HR 76 | Ht 74.0 in | Wt 270.0 lb

## 2019-08-22 DIAGNOSIS — E291 Testicular hypofunction: Secondary | ICD-10-CM | POA: Diagnosis not present

## 2019-08-22 MED ORDER — CLOMIPHENE CITRATE 50 MG PO TABS: 25.0000 mg | ORAL_TABLET | Freq: Every day | ORAL | 3 refills | Status: DC
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
## 2019-08-23 LAB — PSA: Prostate Specific Ag, Serum: 0.4 ng/mL (ref 0.0–4.0)

## 2019-08-23 LAB — HEMATOCRIT: Hematocrit: 45.3 % (ref 37.5–51.0)

## 2019-08-23 LAB — TESTOSTERONE: Testosterone: 349 ng/dL (ref 264–916)

## 2019-08-27 ENCOUNTER — Telehealth: Payer: Self-pay | Admitting: *Deleted

## 2019-08-27 NOTE — Telephone Encounter (Signed)
Notified patient as instructed, patient pleased. Discussed follow-up appointments, patient agrees  

## 2019-08-27 NOTE — Telephone Encounter (Signed)
Started  Cover my meds on 08/27/2019. Key [id number] , waiting on response

## 2019-08-27 NOTE — Telephone Encounter (Signed)
-----   Message from Riki Altes, MD sent at 08/27/2019  9:31 AM EST ----- Testosterone level looks good at 349.  PSA stable at 0.4.  Hematocrit was normal.  Follow-up as scheduled

## 2019-08-28 ENCOUNTER — Other Ambulatory Visit: Payer: Self-pay | Admitting: *Deleted

## 2019-08-28 MED ORDER — CLOMIPHENE CITRATE 50 MG PO TABS: 25.0000 mg | ORAL_TABLET | Freq: Every day | ORAL | 3 refills | Status: DC

## 2019-08-28 NOTE — Telephone Encounter (Signed)
Patient medication is denied . Can I let him use good rx for this .

## 2019-08-28 NOTE — Telephone Encounter (Signed)
Patient to get Clomiphene for [facility] . Insurance denied this med.

## 2019-11-26 ENCOUNTER — Other Ambulatory Visit: Payer: Self-pay

## 2019-11-26 MED ORDER — CLOMIPHENE CITRATE 50 MG PO TABS: 25.0000 mg | ORAL_TABLET | Freq: Every day | ORAL | 3 refills | Status: DC

## 2019-11-26 NOTE — Telephone Encounter (Signed)
Patient called requesting his script be moved to [facility] per his ins

## 2019-12-04 ENCOUNTER — Telehealth: Payer: Self-pay | Admitting: *Deleted

## 2019-12-04 NOTE — Telephone Encounter (Signed)
-----   Message from Riki Altes, MD sent at 12/04/2019  1:56 PM EDT ----- He can get a 41-month supply paying out-of-pocket using a good Rx discount for $23.  If he wants to go that route I will send in an Rx and we can text the coupon to his phone if he has a smart phone ----- Message ----- From: Levada Schilling, CMA Sent: 11/29/2019   4:10 PM EDT To: Riki Altes, MD  Patient was denied for Clomiphene. Please advise,

## 2019-12-04 NOTE — Telephone Encounter (Signed)
He states he got it filled at [facility]

## 2020-01-03 ENCOUNTER — Other Ambulatory Visit: Payer: Self-pay

## 2020-01-03 ENCOUNTER — Telehealth: Payer: Self-pay

## 2020-01-03 DIAGNOSIS — E291 Testicular hypofunction: Secondary | ICD-10-CM

## 2020-01-03 NOTE — Telephone Encounter (Signed)
Ok to add creatinine to bloodwork

## 2020-01-03 NOTE — Telephone Encounter (Signed)
Patient called asking if we are able to add bloodwork ( kidney function test) to his labs we are ordering in July. Patient is currently on Celebrex prescribed by his orthopedic and his ortho would like to check his kidneys in order to keep this patient on the medication. Patient was curious if we can add it to our labs so he does not have to get labs drawn twice.

## 2020-02-19 ENCOUNTER — Other Ambulatory Visit: Payer: Self-pay | Admitting: *Deleted

## 2020-02-19 DIAGNOSIS — E291 Testicular hypofunction: Secondary | ICD-10-CM

## 2020-02-20 ENCOUNTER — Other Ambulatory Visit: Payer: Self-pay

## 2020-03-11 ENCOUNTER — Other Ambulatory Visit: Payer: Self-pay

## 2020-03-11 ENCOUNTER — Other Ambulatory Visit: Payer: BC Managed Care – PPO

## 2020-03-11 DIAGNOSIS — E291 Testicular hypofunction: Secondary | ICD-10-CM

## 2020-03-12 LAB — CREATININE, SERUM
Creatinine, Ser: 1.07 mg/dL (ref 0.76–1.27)
GFR calc Af Amer: 94 mL/min/{1.73_m2} (ref >59)
GFR calc non Af Amer: 82 mL/min/{1.73_m2} (ref >59)

## 2020-03-12 LAB — TESTOSTERONE: Testosterone: 319 ng/dL (ref 264–916)

## 2020-03-12 LAB — HEMATOCRIT: Hematocrit: 42.6 % (ref 37.5–51.0)

## 2020-03-14 ENCOUNTER — Encounter: Payer: Self-pay | Admitting: Family Medicine

## 2020-05-22 ENCOUNTER — Other Ambulatory Visit: Payer: Self-pay

## 2020-05-22 MED ORDER — CLOMIPHENE CITRATE 50 MG PO TABS: 25.0000 mg | ORAL_TABLET | Freq: Every day | ORAL | 2 refills | Status: DC

## 2020-05-22 NOTE — Telephone Encounter (Signed)
Ok to refill 

## 2020-05-23 MED ORDER — CLOMIPHENE CITRATE 50 MG PO TABS: 25.0000 mg | ORAL_TABLET | Freq: Every day | ORAL | 2 refills | Status: DC

## 2020-05-23 NOTE — Addendum Note (Signed)
Addended by: Veneta Penton on: 05/23/2020 01:00 PM   Modules accepted: Orders

## 2020-08-15 ENCOUNTER — Other Ambulatory Visit: Payer: Self-pay

## 2020-08-15 DIAGNOSIS — E291 Testicular hypofunction: Secondary | ICD-10-CM

## 2020-08-19 ENCOUNTER — Other Ambulatory Visit: Payer: Self-pay

## 2020-08-19 ENCOUNTER — Other Ambulatory Visit: Payer: BC Managed Care – PPO

## 2020-08-19 DIAGNOSIS — E291 Testicular hypofunction: Secondary | ICD-10-CM

## 2020-08-20 LAB — PSA: Prostate Specific Ag, Serum: 0.3 ng/mL (ref 0.0–4.0)

## 2020-08-20 LAB — TESTOSTERONE: Testosterone: 343 ng/dL (ref 264–916)

## 2020-08-20 LAB — HEMATOCRIT: Hematocrit: 44.2 % (ref 37.5–51.0)

## 2020-08-21 ENCOUNTER — Ambulatory Visit (INDEPENDENT_AMBULATORY_CARE_PROVIDER_SITE_OTHER): Payer: BC Managed Care – PPO | Admitting: Urology

## 2020-08-21 ENCOUNTER — Other Ambulatory Visit: Payer: Self-pay

## 2020-08-21 ENCOUNTER — Encounter: Payer: Self-pay | Admitting: Urology

## 2020-08-21 VITALS — BP 142/77 | HR 73 | Ht 74.0 in | Wt 265.0 lb

## 2020-08-21 DIAGNOSIS — E291 Testicular hypofunction: Secondary | ICD-10-CM

## 2020-08-21 MED ORDER — TADALAFIL 5 MG PO TABS: ORAL_TABLET | ORAL | 3 refills | Status: DC

## 2020-08-21 MED ORDER — CLOMIPHENE CITRATE 50 MG PO TABS: 25.0000 mg | ORAL_TABLET | Freq: Every day | ORAL | 2 refills | Status: DC
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
## 2021-02-18 ENCOUNTER — Other Ambulatory Visit: Payer: Self-pay

## 2021-02-18 ENCOUNTER — Other Ambulatory Visit: Payer: BC Managed Care – PPO

## 2021-02-18 DIAGNOSIS — E291 Testicular hypofunction: Secondary | ICD-10-CM

## 2021-02-19 LAB — HEMATOCRIT: Hematocrit: 47 % (ref 37.5–51.0)

## 2021-02-19 LAB — TESTOSTERONE: Testosterone: 350 ng/dL (ref 264–916)

## 2021-02-20 ENCOUNTER — Encounter: Payer: Self-pay | Admitting: *Deleted

## 2021-03-05 ENCOUNTER — Ambulatory Visit: Payer: Self-pay | Admitting: Urology

## 2021-03-11 ENCOUNTER — Ambulatory Visit (INDEPENDENT_AMBULATORY_CARE_PROVIDER_SITE_OTHER): Payer: BC Managed Care – PPO | Admitting: Urology

## 2021-03-11 ENCOUNTER — Other Ambulatory Visit: Payer: Self-pay

## 2021-03-11 ENCOUNTER — Encounter: Payer: Self-pay | Admitting: Urology

## 2021-03-11 VITALS — BP 161/82 | HR 76 | Ht 74.0 in | Wt 257.9 lb

## 2021-03-11 DIAGNOSIS — E291 Testicular hypofunction: Secondary | ICD-10-CM

## 2021-03-11 MED ORDER — TESTOSTERONE CYPIONATE 200 MG/ML IM SOLN: 100.0000 mg | INTRAMUSCULAR | 0 refills | Status: DC
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
## 2021-03-23 ENCOUNTER — Telehealth: Payer: Self-pay | Admitting: Family Medicine

## 2021-03-23 NOTE — Telephone Encounter (Signed)
Left message informing patient the testosterone Cypionate has been approved Expires 03/19/2022

## 2021-03-30 ENCOUNTER — Other Ambulatory Visit: Payer: Self-pay | Admitting: Internal Medicine

## 2021-03-30 DIAGNOSIS — R0789 Other chest pain: Secondary | ICD-10-CM

## 2021-03-30 DIAGNOSIS — Z0000 Encounter for general adult medical examination without abnormal findings: Secondary | ICD-10-CM

## 2021-04-17 ENCOUNTER — Other Ambulatory Visit: Payer: Self-pay

## 2021-04-17 ENCOUNTER — Ambulatory Visit
Admission: RE | Admit: 2021-04-17 | Discharge: 2021-04-17 | Disposition: A | Discharge status: Home / Self Care | Payer: Self-pay | Source: Ambulatory Visit | Attending: Internal Medicine | Admitting: Internal Medicine

## 2021-04-17 DIAGNOSIS — Z0000 Encounter for general adult medical examination without abnormal findings: Secondary | ICD-10-CM | POA: Insufficient documentation

## 2021-04-17 DIAGNOSIS — R0789 Other chest pain: Secondary | ICD-10-CM | POA: Insufficient documentation

## 2021-04-23 ENCOUNTER — Other Ambulatory Visit: Payer: Self-pay

## 2021-04-23 DIAGNOSIS — E291 Testicular hypofunction: Secondary | ICD-10-CM

## 2021-04-24 ENCOUNTER — Other Ambulatory Visit: Payer: BC Managed Care – PPO

## 2021-04-24 ENCOUNTER — Other Ambulatory Visit: Payer: Self-pay

## 2021-04-24 DIAGNOSIS — E291 Testicular hypofunction: Secondary | ICD-10-CM

## 2021-04-29 ENCOUNTER — Other Ambulatory Visit: Payer: Self-pay

## 2021-04-29 LAB — TESTOSTERONE: Testosterone: 954 ng/dL — ABNORMAL HIGH (ref 264–916)

## 2021-04-29 NOTE — Telephone Encounter (Signed)
Incoming VM on triage line from patient in regards to testosterone lab results. Patient is concerned that he hasnt heard from anyone and cannot view them on mychart. He would like a call back to discuss.

## 2021-04-30 ENCOUNTER — Telehealth: Payer: Self-pay | Admitting: Urology

## 2021-04-30 MED ORDER — TESTOSTERONE CYPIONATE 200 MG/ML IM SOLN: 100.0000 mg | INTRAMUSCULAR | 0 refills | Status: DC

## 2021-04-30 NOTE — Telephone Encounter (Signed)
Testosterone level slightly elevated at 954.  When was his last injection in relation to this blood draw?

## 2021-04-30 NOTE — Telephone Encounter (Signed)
Pt calls triage line requesting refill of Testosterone sent to [facility]. Please advise.

## 2021-04-30 NOTE — Addendum Note (Signed)
Addended by: Frankey Shown on: 04/30/2021 04:18 PM   Modules accepted: Orders

## 2021-05-01 ENCOUNTER — Encounter: Payer: Self-pay | Admitting: *Deleted

## 2021-05-06 ENCOUNTER — Encounter: Payer: Self-pay | Admitting: *Deleted

## 2021-05-06 NOTE — Telephone Encounter (Signed)
Last testosterone level was slightly elevated.  Need to know when his last injection was in relation to this blood draw

## 2021-07-29 ENCOUNTER — Other Ambulatory Visit: Payer: Self-pay | Admitting: *Deleted

## 2021-07-31 MED ORDER — TESTOSTERONE CYPIONATE 200 MG/ML IM SOLN: 100.0000 mg | INTRAMUSCULAR | 0 refills | Status: DC

## 2021-08-14 ENCOUNTER — Emergency Department: Admission: EM | Admit: 2021-08-14 | Discharge: 2021-08-14 | Discharge status: Against Medical Advice | Payer: Self-pay

## 2021-08-14 ENCOUNTER — Other Ambulatory Visit: Payer: Self-pay

## 2021-08-14 NOTE — ED Triage Notes (Signed)
Patient ambulatory to triage with steady gait, without difficulty or distress noted; pt reports rt lower abd pain,nonradiating since yesterday with no accomp symptoms

## 2021-08-20 ENCOUNTER — Other Ambulatory Visit: Payer: Self-pay

## 2021-08-20 DIAGNOSIS — E291 Testicular hypofunction: Secondary | ICD-10-CM

## 2021-08-21 ENCOUNTER — Other Ambulatory Visit: Payer: Self-pay

## 2021-08-21 ENCOUNTER — Other Ambulatory Visit: Payer: BC Managed Care – PPO

## 2021-08-21 DIAGNOSIS — E291 Testicular hypofunction: Secondary | ICD-10-CM

## 2021-08-22 LAB — TESTOSTERONE: Testosterone: >1500 ng/dL — ABNORMAL HIGH (ref 264–916)

## 2021-08-22 LAB — HEMATOCRIT: Hematocrit: 42.5 % (ref 37.5–51.0)

## 2021-08-22 LAB — PSA: Prostate Specific Ag, Serum: 0.5 ng/mL (ref 0.0–4.0)

## 2021-08-24 ENCOUNTER — Ambulatory Visit: Payer: Self-pay | Admitting: Urology

## 2021-08-26 ENCOUNTER — Encounter: Payer: Self-pay | Admitting: Urology

## 2021-08-26 ENCOUNTER — Other Ambulatory Visit: Payer: Self-pay

## 2021-08-26 ENCOUNTER — Ambulatory Visit (INDEPENDENT_AMBULATORY_CARE_PROVIDER_SITE_OTHER): Payer: BC Managed Care – PPO | Admitting: Urology

## 2021-08-26 VITALS — BP 142/81 | HR 99 | Ht 74.0 in | Wt 270.0 lb

## 2021-08-26 DIAGNOSIS — E291 Testicular hypofunction: Secondary | ICD-10-CM

## 2021-08-26 MED ORDER — TADALAFIL 5 MG PO TABS: ORAL_TABLET | ORAL | 3 refills | Status: DC
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
## 2021-08-27 ENCOUNTER — Encounter: Payer: Self-pay | Admitting: Urology

## 2021-08-27 MED ORDER — TESTOSTERONE CYPIONATE 200 MG/ML IM SOLN: 100.0000 mg | INTRAMUSCULAR | 0 refills | Status: DC

## 2021-09-24 ENCOUNTER — Other Ambulatory Visit: Payer: BC Managed Care – PPO

## 2021-09-25 ENCOUNTER — Other Ambulatory Visit: Payer: BC Managed Care – PPO

## 2021-09-25 ENCOUNTER — Other Ambulatory Visit: Payer: Self-pay

## 2021-09-25 DIAGNOSIS — E291 Testicular hypofunction: Secondary | ICD-10-CM

## 2021-09-26 LAB — TESTOSTERONE: Testosterone: 598 ng/dL (ref 264–916)

## 2021-09-26 LAB — HEMATOCRIT: Hematocrit: 44.9 % (ref 37.5–51.0)

## 2021-09-28 ENCOUNTER — Encounter: Payer: Self-pay | Admitting: *Deleted

## 2021-12-27 ENCOUNTER — Emergency Department
Admission: EM | Admit: 2021-12-27 | Discharge: 2021-12-27 | Disposition: A | Discharge status: Home / Self Care | Payer: BC Managed Care – PPO | Attending: Emergency Medicine | Admitting: Emergency Medicine

## 2021-12-27 ENCOUNTER — Encounter: Payer: Self-pay | Admitting: Emergency Medicine

## 2021-12-27 ENCOUNTER — Emergency Department: Payer: BC Managed Care – PPO

## 2021-12-27 DIAGNOSIS — R109 Unspecified abdominal pain: Secondary | ICD-10-CM | POA: Diagnosis present

## 2021-12-27 DIAGNOSIS — K529 Noninfective gastroenteritis and colitis, unspecified: Secondary | ICD-10-CM

## 2021-12-27 DIAGNOSIS — R1011 Right upper quadrant pain: Secondary | ICD-10-CM | POA: Diagnosis not present

## 2021-12-27 DIAGNOSIS — R112 Nausea with vomiting, unspecified: Secondary | ICD-10-CM | POA: Insufficient documentation

## 2021-12-27 LAB — URINALYSIS, ROUTINE W REFLEX MICROSCOPIC
Bilirubin Urine: NEGATIVE
Glucose, UA: NEGATIVE mg/dL
Hgb urine dipstick: NEGATIVE
Ketones, ur: 5 mg/dL — AB
Leukocytes,Ua: NEGATIVE
Nitrite: NEGATIVE
Protein, ur: NEGATIVE mg/dL
Specific Gravity, Urine: 1.03 (ref 1.005–1.030)
pH: 6 (ref 5.0–8.0)

## 2021-12-27 LAB — CBC WITH DIFFERENTIAL/PLATELET
Abs Immature Granulocytes: 0.02 10*3/uL (ref 0.00–0.07)
Basophils Absolute: 0 10*3/uL (ref 0.0–0.1)
Basophils Relative: 1 %
Eosinophils Absolute: 0.1 10*3/uL (ref 0.0–0.5)
Eosinophils Relative: 2 %
HCT: 45.6 % (ref 39.0–52.0)
Hemoglobin: 14.4 g/dL (ref 13.0–17.0)
Immature Granulocytes: 0 %
Lymphocytes Relative: 20 %
Lymphs Abs: 1.2 10*3/uL (ref 0.7–4.0)
MCH: 28.6 pg (ref 26.0–34.0)
MCHC: 31.6 g/dL (ref 30.0–36.0)
MCV: 90.5 fL (ref 80.0–100.0)
Monocytes Absolute: 0.5 10*3/uL (ref 0.1–1.0)
Monocytes Relative: 9 %
Neutro Abs: 4 10*3/uL (ref 1.7–7.7)
Neutrophils Relative %: 68 %
Platelets: 211 10*3/uL (ref 150–400)
RBC: 5.04 MIL/uL (ref 4.22–5.81)
RDW: 14.2 % (ref 11.5–15.5)
WBC: 5.8 10*3/uL (ref 4.0–10.5)
nRBC: 0 % (ref 0.0–0.2)

## 2021-12-27 LAB — COMPREHENSIVE METABOLIC PANEL
ALT: 34 U/L (ref 0–44)
AST: 34 U/L (ref 15–41)
Albumin: 3.8 g/dL (ref 3.5–5.0)
Alkaline Phosphatase: 35 U/L — ABNORMAL LOW (ref 38–126)
Anion gap: 6 (ref 5–15)
BUN: 12 mg/dL (ref 6–20)
CO2: 25 mmol/L (ref 22–32)
Calcium: 8.9 mg/dL (ref 8.9–10.3)
Chloride: 103 mmol/L (ref 98–111)
Creatinine, Ser: 0.96 mg/dL (ref 0.61–1.24)
GFR, Estimated: >60 mL/min (ref >60)
Glucose, Bld: 93 mg/dL (ref 70–99)
Potassium: 4 mmol/L (ref 3.5–5.1)
Sodium: 134 mmol/L — ABNORMAL LOW (ref 135–145)
Total Bilirubin: 1.3 mg/dL — ABNORMAL HIGH (ref 0.3–1.2)
Total Protein: 7.6 g/dL (ref 6.5–8.1)

## 2021-12-27 LAB — TROPONIN I (HIGH SENSITIVITY)
Troponin I (High Sensitivity): 8 ng/L (ref <18)
Troponin I (High Sensitivity): 8 ng/L (ref <18)

## 2021-12-27 LAB — LIPASE, BLOOD: Lipase: 25 U/L (ref 11–51)

## 2021-12-27 MED ORDER — FAMOTIDINE IN NACL 20-0.9 MG/50ML-% IV SOLN
20.0000 mg | Freq: Once | INTRAVENOUS | Status: AC
  Administered 2021-12-27: 20 mg via INTRAVENOUS
  Filled 2021-12-27: qty 50

## 2021-12-27 MED ORDER — IOHEXOL 300 MG/ML  SOLN
100.0000 mL | Freq: Once | INTRAMUSCULAR | Status: AC | PRN
  Administered 2021-12-27: 100 mL via INTRAVENOUS

## 2021-12-27 MED ORDER — HYDROCODONE-ACETAMINOPHEN 5-325 MG PO TABS: 1.0000 | ORAL_TABLET | Freq: Four times a day (QID) | ORAL | 0 refills | Status: DC | PRN

## 2021-12-27 MED ORDER — HYDROCODONE-ACETAMINOPHEN 5-325 MG PO TABS
1.0000 | ORAL_TABLET | Freq: Once | ORAL | Status: AC
  Administered 2021-12-27: 1 via ORAL
  Filled 2021-12-27: qty 1

## 2021-12-27 MED ORDER — SODIUM CHLORIDE 0.9 % IV BOLUS
1000.0000 mL | Freq: Once | INTRAVENOUS | Status: AC
  Administered 2021-12-27: 1000 mL via INTRAVENOUS

## 2021-12-27 MED ORDER — FENTANYL CITRATE PF 50 MCG/ML IJ SOSY
50.0000 ug | PREFILLED_SYRINGE | Freq: Once | INTRAMUSCULAR | Status: AC
  Administered 2021-12-27: 50 ug via INTRAVENOUS
  Filled 2021-12-27: qty 1

## 2021-12-27 NOTE — ED Triage Notes (Signed)
Pt presents with complaints of right sided abdominal pain for the last several days that has gotten progressively worse over the course of the night. Hx of gallbladder sx. Denies N/V.

## 2021-12-27 NOTE — ED Notes (Signed)
Pt to CT

## 2021-12-27 NOTE — Discharge Instructions (Addendum)
No driving today, or driving or working around heavy equipment within 8 hours of taking hydrocodone.  Please return to the emergency room right away if you are to develop a fever, severe nausea, your pain becomes severe or worsens, you are unable to keep food down, begin vomiting any dark or bloody fluid, you develop any dark or bloody stools, feel dehydrated, or other new concerns or symptoms arise.
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
## 2021-12-27 NOTE — ED Provider Notes (Signed)
CT Abdomen Pelvis W Contrast  Result Date: 12/27/2021 CLINICAL DATA:  51 year old male with abdominal pain for the past 2 weeks. History of gallbladder surgery in March. EXAM: CT ABDOMEN AND PELVIS WITH CONTRAST TECHNIQUE: Multidetector CT imaging of the abdomen and pelvis was performed using the standard protocol following bolus administration of intravenous contrast. RADIATION DOSE REDUCTION: This exam was performed according to the departmental dose-optimization program which includes automated exposure control, adjustment of the mA and/or kV according to patient size and/or use of iterative reconstruction technique. CONTRAST:  OMNIPAQUE IOHEXOL 300 MG/ML  SOLN COMPARISON:  CT Abdomen 07/14/2016. FINDINGS: Lower chest: Mild lung base atelectasis, otherwise negative. Hepatobiliary: Interval cholecystectomy. Small volume heterogeneous probable granulation tissue in the gallbladder fossa now (coronal images 46 and 48) inseparable from the porta hepatis and central bile ducts but no biliary ductal dilatation is identified. Liver enhancement remains within normal limits. No perihepatic free fluid identified. Pancreas: Within normal limits. Spleen: Negative. Adrenals/Urinary Tract: Negative. Stomach/Bowel: Decompressed large bowel from the splenic flexure distally. Air and fluid containing right colon and hepatic flexure. Normal appendix visible on coronal image 63. No large bowel inflammation identified. Terminal ileum is fluid-filled but otherwise negative. Multiple similar fluid-filled but nondilated small bowel loops throughout the lower abdomen and pelvis. Decompressed stomach. Duodenum appears within normal limits. No free air or free fluid. No mesenteric inflammation identified. Vascular/Lymphatic: Major arterial structures in the abdomen and pelvis appear patent and normal. No calcified atherosclerosis or lymphadenopathy identified. Early portal venous phase contrast timing on the initial images,  grossly patent main portal vein on the delayed images. Reproductive: Negative. Other: No pelvic free fluid. Numerous pelvic phleboliths. Musculoskeletal: No acute osseous abnormality identified. Chronic severe right hip joint degeneration. Bulky osteophytes and subchondral cysts at the right hip joint. IMPRESSION: 1. Fluid-filled but nondilated small bowel and proximal colon suggesting enteritis/diarrhea. No bowel obstruction or discrete bowel inflammation. Normal appendix. 2. Interval cholecystectomy with suspected granulation tissue in the gallbladder fossa. No biliary ductal dilatation or definite adverse features. 3. Chronic severe right hip joint degeneration. Electronically Signed   By: Odessa Fleming M.D.   On: 12/27/2021 07:49     ----------------------------------------- 8:15 AM on 12/27/2021 -----------------------------------------  Patient reports pain mild located mostly in the mid right flank to right upper abdomen.  Denies any respiratory symptoms.  No chest pain.  Very pleasant, he relates that he had some chronic pains in this area, but it seems to be exacerbated after he ate what he thought was some bad food.  His CT imaging shows findings of enteritis but no evidence of obstruction.  Normal appendix.  His labs were reassuring as well.  He is fully alert well oriented, comfortable with the plan for careful return precautions which have discussed with him.  Return precautions and treatment recommendations and follow-up discussed with the patient who is agreeable with the plan.      Sharyn Creamer, MD 12/27/21 [phone]

## 2021-12-28 LAB — URINE CULTURE: Culture: <10000 — AB

## 2022-01-07 ENCOUNTER — Other Ambulatory Visit: Payer: Self-pay | Admitting: Urology

## 2022-02-11 ENCOUNTER — Other Ambulatory Visit: Payer: Self-pay | Admitting: Family Medicine

## 2022-02-11 DIAGNOSIS — E291 Testicular hypofunction: Secondary | ICD-10-CM

## 2022-02-25 ENCOUNTER — Other Ambulatory Visit: Payer: BC Managed Care – PPO

## 2022-02-25 DIAGNOSIS — E291 Testicular hypofunction: Secondary | ICD-10-CM

## 2022-02-26 ENCOUNTER — Encounter: Payer: Self-pay | Admitting: *Deleted

## 2022-02-26 LAB — HEMATOCRIT: Hematocrit: 42.2 % (ref 37.5–51.0)

## 2022-02-26 LAB — TESTOSTERONE: Testosterone: 284 ng/dL (ref 264–916)

## 2022-02-26 NOTE — Telephone Encounter (Signed)
Pt LM on triage line requesting call back from [place]

## 2022-02-26 NOTE — Telephone Encounter (Signed)
[place] . Marland Kitchen He did not want to take hie injections with him . He is starting them back today .

## 2022-03-18 ENCOUNTER — Other Ambulatory Visit: Payer: Self-pay | Admitting: Urology

## 2022-03-23 ENCOUNTER — Other Ambulatory Visit: Payer: Self-pay | Admitting: *Deleted

## 2022-03-23 MED ORDER — TESTOSTERONE CYPIONATE 200 MG/ML IM SOLN: INTRAMUSCULAR | 0 refills | Status: DC

## 2022-06-02 ENCOUNTER — Other Ambulatory Visit: Payer: Self-pay | Admitting: Urology

## 2022-06-08 ENCOUNTER — Other Ambulatory Visit: Payer: Self-pay | Admitting: Urology

## 2022-06-17 ENCOUNTER — Other Ambulatory Visit: Payer: Self-pay | Admitting: Physician Assistant

## 2022-06-17 DIAGNOSIS — S46112A Strain of muscle, fascia and tendon of long head of biceps, left arm, initial encounter: Secondary | ICD-10-CM

## 2022-06-23 ENCOUNTER — Ambulatory Visit: Payer: BC Managed Care – PPO

## 2022-06-26 ENCOUNTER — Ambulatory Visit
Admission: RE | Admit: 2022-06-26 | Discharge: 2022-06-26 | Disposition: A | Discharge status: Home / Self Care | Payer: BC Managed Care – PPO | Source: Ambulatory Visit | Attending: Physician Assistant | Admitting: Physician Assistant

## 2022-06-26 DIAGNOSIS — S46112A Strain of muscle, fascia and tendon of long head of biceps, left arm, initial encounter: Secondary | ICD-10-CM

## 2022-06-30 ENCOUNTER — Ambulatory Visit: Payer: BC Managed Care – PPO

## 2022-08-17 ENCOUNTER — Other Ambulatory Visit: Payer: Self-pay | Admitting: Urology

## 2022-08-26 ENCOUNTER — Other Ambulatory Visit: Payer: BC Managed Care – PPO

## 2022-08-26 ENCOUNTER — Other Ambulatory Visit: Payer: Self-pay | Admitting: *Deleted

## 2022-08-26 DIAGNOSIS — E291 Testicular hypofunction: Secondary | ICD-10-CM

## 2022-08-27 ENCOUNTER — Ambulatory Visit (INDEPENDENT_AMBULATORY_CARE_PROVIDER_SITE_OTHER): Payer: BC Managed Care – PPO | Admitting: Urology

## 2022-08-27 ENCOUNTER — Telehealth: Payer: Self-pay | Admitting: *Deleted

## 2022-08-27 ENCOUNTER — Encounter: Payer: Self-pay | Admitting: Urology

## 2022-08-27 VITALS — BP 130/70 | HR 74 | Ht 76.0 in | Wt 270.0 lb

## 2022-08-27 DIAGNOSIS — E291 Testicular hypofunction: Secondary | ICD-10-CM

## 2022-08-27 LAB — HEMATOCRIT: Hematocrit: 43.3 % (ref 37.5–51.0)

## 2022-08-27 LAB — TESTOSTERONE: Testosterone: 740 ng/dL (ref 264–916)

## 2022-08-27 NOTE — Telephone Encounter (Signed)
Pt call back after OV today, asking if he can get an increase in tadalafil? Pt currently taking 5mg  daily, pt uses [facility] [place].
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
## 2022-08-30 MED ORDER — TADALAFIL 10 MG PO TABS: ORAL_TABLET | ORAL | 1 refills | Status: DC

## 2022-10-15 ENCOUNTER — Other Ambulatory Visit: Payer: Self-pay | Admitting: Urology

## 2022-12-09 ENCOUNTER — Other Ambulatory Visit: Payer: Self-pay | Admitting: Urology

## 2022-12-10 ENCOUNTER — Other Ambulatory Visit: Payer: Self-pay | Admitting: Urology

## 2022-12-31 ENCOUNTER — Other Ambulatory Visit: Payer: Self-pay | Admitting: Urology

## 2023-02-24 ENCOUNTER — Other Ambulatory Visit: Payer: BC Managed Care – PPO

## 2023-03-11 ENCOUNTER — Telehealth: Payer: Self-pay | Admitting: Urology

## 2023-03-11 NOTE — Telephone Encounter (Signed)
[facility] advised that [facility] system is down and they may not be able to process medication today, they are not able to get Erx of fax to come through. Advised patient we would try and send this in on Monday. Patient understood.

## 2023-03-11 NOTE — Telephone Encounter (Signed)
Pt would like a new RX for Tadalafil 10 mg sent to [facility], today if possible.  He used [facility] previously, but is not happy with them and would like to use [facility].  He has a few left, but would like to pick up over the weekend, if possible.

## 2023-03-14 ENCOUNTER — Other Ambulatory Visit: Payer: Self-pay | Admitting: *Deleted

## 2023-03-14 MED ORDER — TADALAFIL 10 MG PO TABS: ORAL_TABLET | ORAL | 1 refills | Status: DC

## 2023-03-14 NOTE — Telephone Encounter (Signed)
Medication sent to [facility]

## 2023-05-13 ENCOUNTER — Other Ambulatory Visit: Payer: No Typology Code available for payment source

## 2023-05-13 DIAGNOSIS — E291 Testicular hypofunction: Secondary | ICD-10-CM

## 2023-05-14 LAB — TESTOSTERONE: Testosterone: 637 ng/dL (ref 264–916)

## 2023-05-14 LAB — HEMATOCRIT: Hematocrit: 44.6 % (ref 37.5–51.0)

## 2023-05-22 ENCOUNTER — Encounter: Payer: Self-pay | Admitting: Urology

## 2023-05-23 ENCOUNTER — Other Ambulatory Visit: Payer: Self-pay | Admitting: *Deleted

## 2023-05-23 MED ORDER — TESTOSTERONE CYPIONATE 200 MG/ML IM SOLN: INTRAMUSCULAR | 0 refills | Status: DC

## 2023-08-14 ENCOUNTER — Ambulatory Visit
Admission: EM | Admit: 2023-08-14 | Discharge: 2023-08-14 | Disposition: A | Discharge status: Home / Self Care | Payer: No Typology Code available for payment source | Attending: Emergency Medicine | Admitting: Emergency Medicine

## 2023-08-14 ENCOUNTER — Other Ambulatory Visit: Payer: Self-pay

## 2023-08-14 ENCOUNTER — Encounter: Payer: Self-pay | Admitting: *Deleted

## 2023-08-14 DIAGNOSIS — J0110 Acute frontal sinusitis, unspecified: Secondary | ICD-10-CM | POA: Diagnosis not present

## 2023-08-14 MED ORDER — AMOXICILLIN-POT CLAVULANATE 875-125 MG PO TABS: 1.0000 | ORAL_TABLET | Freq: Two times a day (BID) | ORAL | 0 refills | Status: AC

## 2023-08-14 MED ORDER — HYDROCOD POLI-CHLORPHE POLI ER 10-8 MG/5ML PO SUER: 5.0000 mL | Freq: Every evening | ORAL | 0 refills | Status: DC | PRN

## 2023-08-14 NOTE — Discharge Instructions (Signed)
 Today you are being treated for a sinus infection  Begin Augmentin  every morning and every evening for 10 days  Use Tussionex at bedtime and Tessalon throughout the day  If wheezing becomes troublesome begin use of steroids    You can take Tylenol  and/or Ibuprofen as needed for fever reduction and pain relief.   For cough: honey 1/2 to 1 teaspoon (you can dilute the honey in water or another fluid).  You can also use guaifenesin and dextromethorphan for cough. You can use a humidifier for chest congestion and cough.  If you don't have a humidifier, you can sit in the bathroom with the hot shower running.      For sore throat: try warm salt water gargles, cepacol lozenges, throat spray, warm tea or water with lemon/honey, popsicles or ice, or OTC cold relief medicine for throat discomfort.   For congestion: take a daily anti-histamine like Zyrtec, Claritin, and a oral decongestant, such as pseudoephedrine.  You can also use Flonase 1-2 sprays in each nostril daily.   It is important to stay hydrated: drink plenty of fluids (water, gatorade/powerade/pedialyte, juices, or teas) to keep your throat moisturized and help further relieve irritation/discomfort.

## 2023-08-14 NOTE — ED Triage Notes (Addendum)
 C/O cough, sinus pressure, congestion, not sleeping, chest congestion, dark mucus x 7 days.  States was treated for sinusitis a couple weeks ago (Tussionex, Levaquin; was also prescribed prednisone, but chose not to take it due to side effects), prior to leaving for [place], and initially felt better, but then started to feel ill again while traveling.

## 2023-08-15 ENCOUNTER — Telehealth: Payer: Self-pay | Admitting: Urology

## 2023-08-15 NOTE — Telephone Encounter (Signed)
 Pt wants to know if Dr Lonna Cobb would add CBC, LDL and CMP to labwork.  He didn't go to pcp last year due to changing jobs and insurance.  He had his gall bladder removed and would like to keep check on liver.

## 2023-08-15 NOTE — Telephone Encounter (Signed)
 He will need to get a pcp to check him once a year .
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
## 2023-08-19 ENCOUNTER — Other Ambulatory Visit: Payer: No Typology Code available for payment source

## 2023-08-19 DIAGNOSIS — E291 Testicular hypofunction: Secondary | ICD-10-CM

## 2023-08-20 LAB — TESTOSTERONE: Testosterone: 467 ng/dL (ref 264–916)

## 2023-08-20 LAB — HEMATOCRIT: Hematocrit: 41.5 % (ref 37.5–51.0)

## 2023-08-29 ENCOUNTER — Other Ambulatory Visit: Payer: BC Managed Care – PPO

## 2023-08-29 ENCOUNTER — Other Ambulatory Visit: Payer: Self-pay | Admitting: Urology

## 2023-09-01 ENCOUNTER — Ambulatory Visit: Payer: BC Managed Care – PPO | Admitting: Urology

## 2023-09-09 ENCOUNTER — Ambulatory Visit (INDEPENDENT_AMBULATORY_CARE_PROVIDER_SITE_OTHER): Payer: No Typology Code available for payment source | Admitting: Urology

## 2023-09-09 ENCOUNTER — Encounter: Payer: Self-pay | Admitting: Urology

## 2023-09-09 VITALS — BP 144/80 | HR 90 | Ht 74.0 in | Wt 257.2 lb

## 2023-09-09 DIAGNOSIS — E291 Testicular hypofunction: Secondary | ICD-10-CM
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
## 2023-09-10 LAB — COMPREHENSIVE METABOLIC PANEL
ALT: 23 [IU]/L (ref 0–44)
AST: 24 [IU]/L (ref 0–40)
Albumin: 3.9 g/dL (ref 3.8–4.9)
Alkaline Phosphatase: 41 [IU]/L — ABNORMAL LOW (ref 44–121)
BUN/Creatinine Ratio: 14 (ref 9–20)
BUN: 13 mg/dL (ref 6–24)
Bilirubin Total: 0.5 mg/dL (ref 0.0–1.2)
CO2: 23 mmol/L (ref 20–29)
Calcium: 9 mg/dL (ref 8.7–10.2)
Chloride: 103 mmol/L (ref 96–106)
Creatinine, Ser: 0.96 mg/dL (ref 0.76–1.27)
Globulin, Total: 2.5 g/dL (ref 1.5–4.5)
Glucose: 91 mg/dL (ref 70–99)
Potassium: 4.7 mmol/L (ref 3.5–5.2)
Sodium: 140 mmol/L (ref 134–144)
Total Protein: 6.4 g/dL (ref 6.0–8.5)
eGFR: 96 mL/min/{1.73_m2} (ref >59)

## 2023-09-10 LAB — PSA: Prostate Specific Ag, Serum: 1.1 ng/mL (ref 0.0–4.0)

## 2023-09-14 ENCOUNTER — Other Ambulatory Visit: Payer: Self-pay | Admitting: Urology

## 2023-10-31 ENCOUNTER — Ambulatory Visit: Payer: Self-pay | Admitting: Anesthesiology

## 2023-10-31 ENCOUNTER — Other Ambulatory Visit: Payer: Self-pay

## 2023-10-31 ENCOUNTER — Encounter: Payer: Self-pay | Admitting: Gastroenterology

## 2023-10-31 ENCOUNTER — Encounter
Admission: RE | Disposition: A | Discharge status: Home / Self Care | Payer: Self-pay | Source: Home / Self Care | Attending: Gastroenterology

## 2023-10-31 ENCOUNTER — Ambulatory Visit
Admission: RE | Admit: 2023-10-31 | Discharge: 2023-10-31 | Disposition: A | Discharge status: Home / Self Care | Attending: Gastroenterology | Admitting: Gastroenterology

## 2023-10-31 DIAGNOSIS — Z800 Family history of malignant neoplasm of digestive organs: Secondary | ICD-10-CM | POA: Diagnosis not present

## 2023-10-31 DIAGNOSIS — D120 Benign neoplasm of cecum: Secondary | ICD-10-CM | POA: Insufficient documentation

## 2023-10-31 DIAGNOSIS — K644 Residual hemorrhoidal skin tags: Secondary | ICD-10-CM | POA: Diagnosis not present

## 2023-10-31 DIAGNOSIS — D509 Iron deficiency anemia, unspecified: Secondary | ICD-10-CM | POA: Insufficient documentation

## 2023-10-31 DIAGNOSIS — K2970 Gastritis, unspecified, without bleeding: Secondary | ICD-10-CM | POA: Insufficient documentation

## 2023-10-31 DIAGNOSIS — K921 Melena: Secondary | ICD-10-CM | POA: Diagnosis present

## 2023-10-31 DIAGNOSIS — K648 Other hemorrhoids: Secondary | ICD-10-CM | POA: Diagnosis not present

## 2023-10-31 DIAGNOSIS — K2270 Barrett's esophagus without dysplasia: Secondary | ICD-10-CM | POA: Insufficient documentation

## 2023-10-31 DIAGNOSIS — K319 Disease of stomach and duodenum, unspecified: Secondary | ICD-10-CM | POA: Insufficient documentation

## 2023-10-31 HISTORY — PX: POLYPECTOMY: SHX5525

## 2023-10-31 HISTORY — PX: COLONOSCOPY: SHX5424

## 2023-10-31 HISTORY — PX: SUBMUCOSAL INJECTION: SHX5543

## 2023-10-31 HISTORY — PX: HEMOSTASIS CLIP PLACEMENT: SHX6857

## 2023-10-31 HISTORY — PX: ESOPHAGOGASTRODUODENOSCOPY: SHX5428

## 2023-10-31 SURGERY — COLONOSCOPY
Anesthesia: General

## 2023-10-31 MED ORDER — DEXMEDETOMIDINE HCL IN NACL 400 MCG/100ML IV SOLN
INTRAVENOUS | Status: DC | PRN
  Administered 2023-10-31: 4 ug via INTRAVENOUS

## 2023-10-31 MED ORDER — PROPOFOL 10 MG/ML IV BOLUS
INTRAVENOUS | Status: AC
  Filled 2023-10-31: qty 20

## 2023-10-31 MED ORDER — SIMETHICONE 40 MG/0.6ML PO SUSP
ORAL | Status: DC | PRN
  Administered 2023-10-31: 180 mL via ORAL

## 2023-10-31 MED ORDER — SODIUM CHLORIDE 0.9 % IV SOLN: INTRAVENOUS | Status: DC

## 2023-10-31 MED ORDER — FENTANYL CITRATE (PF) 100 MCG/2ML IJ SOLN
INTRAMUSCULAR | Status: DC | PRN
  Administered 2023-10-31: 50 ug via INTRAVENOUS

## 2023-10-31 MED ORDER — PROPOFOL 500 MG/50ML IV EMUL
INTRAVENOUS | Status: DC | PRN
  Administered 2023-10-31: 150 ug/kg/min via INTRAVENOUS
  Administered 2023-10-31: 120 mg via INTRAVENOUS

## 2023-10-31 MED ORDER — FENTANYL CITRATE (PF) 100 MCG/2ML IJ SOLN
INTRAMUSCULAR | Status: AC
  Filled 2023-10-31: qty 2

## 2023-10-31 MED ORDER — LIDOCAINE HCL (CARDIAC) PF 100 MG/5ML IV SOSY
PREFILLED_SYRINGE | INTRAVENOUS | Status: DC | PRN
Start: 2023-10-31 — End: 2023-10-31
  Administered 2023-10-31: 100 mg via INTRAVENOUS

## 2023-10-31 MED ORDER — PROPOFOL 10 MG/ML IV BOLUS
INTRAVENOUS | Status: AC
  Filled 2023-10-31: qty 60

## 2023-10-31 MED ORDER — EPHEDRINE SULFATE-NACL 50-0.9 MG/10ML-% IV SOSY
PREFILLED_SYRINGE | INTRAVENOUS | Status: DC | PRN
  Administered 2023-10-31: 10 mg via INTRAVENOUS

## 2023-10-31 NOTE — Op Note (Signed)
 [facility] Gastroenterology Patient Name: Eddie Clark Procedure Date: 10/31/2023 8:25 AM MRN: [id number] Account #: [id number] Date of Birth: Nov 28, 1970 Admit Type: Outpatient Age: 53 Room: [facility] ENDO ROOM 2 Gender: Male Note Status: Supervisor Override Instrument Name: Patton Salles Endoscope [id number] Procedure:             Upper GI endoscopy Indications:           Surveillance for malignancy due to personal history of                         Barrett's esophagus, Iron deficiency anemia Providers:             Trenda Moots, DO Referring MD:          Danella Penton, MD (Referring MD) Medicines:             Monitored Anesthesia Care Complications:         No immediate complications. Estimated blood loss:                         Minimal. Procedure:             Pre-Anesthesia Assessment:                        - Prior to the procedure, a History and Physical was                         performed, and patient medications and allergies were                         reviewed. The patient is competent. The risks and                         benefits of the procedure and the sedation options and                         risks were discussed with the patient. All questions                         were answered and informed consent was obtained.                         Patient identification and proposed procedure were                         verified by the physician, the nurse, the anesthetist                         and the technician in the endoscopy suite. Mental                         Status Examination: alert and oriented. Airway                         Examination: normal oropharyngeal airway and neck                         mobility. Respiratory Examination: clear to  auscultation. CV Examination: RRR, no murmurs, no S3                         or S4. Prophylactic Antibiotics: The patient does not                         require prophylactic  antibiotics. Prior                         Anticoagulants: The patient has taken no anticoagulant                         or antiplatelet agents. ASA Grade Assessment: II - A                         patient with mild systemic disease. After reviewing                         the risks and benefits, the patient was deemed in                         satisfactory condition to undergo the procedure. The                         anesthesia plan was to use monitored anesthesia care                         (MAC). Immediately prior to administration of                         medications, the patient was re-assessed for adequacy                         to receive sedatives. The heart rate, respiratory                         rate, oxygen saturations, blood pressure, adequacy of                         pulmonary ventilation, and response to care were                         monitored throughout the procedure. The physical                         status of the patient was re-assessed after the                         procedure.                        After obtaining informed consent, the endoscope was                         passed under direct vision. Throughout the procedure,                         the patient's blood pressure, pulse, and oxygen  saturations were monitored continuously. The Endoscope                         was introduced through the mouth, and advanced to the                         third part of duodenum. The upper GI endoscopy was                         accomplished without difficulty. The patient tolerated                         the procedure well. Findings:      The duodenal bulb, first portion of the duodenum, second portion of the       duodenum and third portion of the duodenum were normal. Estimated blood       loss: none.      Localized mild inflammation characterized by erythema was found in the       gastric antrum. Biopsies were taken with a  cold forceps for Helicobacter       pylori testing. Biopsies were taken with a cold forceps for histology.       Estimated blood loss was minimal.      The exam of the stomach was otherwise normal.      Esophagogastric landmarks were identified: the gastroesophageal junction       was found at 45 cm from the incisors.      There were esophageal mucosal changes classified as Barrett's stage       C0-M1 per Prague criteria present at the gastroesophageal junction.       Mucosa was biopsied with a cold forceps for histology. One specimen       bottle was sent to pathology. taken in 4 quadrant fashion. Estimated       blood loss was minimal. Imaging was performed using white light and       narrow band imaging to visualize the mucosa.      The exam of the esophagus was otherwise normal. Impression:            - Normal duodenal bulb, first portion of the duodenum,                         second portion of the duodenum and third portion of                         the duodenum.                        - Gastritis. Biopsied.                        - Esophagogastric landmarks identified.                        - Esophageal mucosal changes classified as Barrett's                         stage C0-M1 per Prague criteria. Biopsied. Recommendation:        - Patient has a contact number available for  emergencies. The signs and symptoms of potential                         delayed complications were discussed with the patient.                         Return to normal activities tomorrow. Written                         discharge instructions were provided to the patient.                        - Discharge patient to home.                        - Resume previous diet.                        - Continue present medications.                        - Await pathology results.                        - Repeat upper endoscopy for surveillance based on                         pathology  results.                        - Return to GI office as previously scheduled.                        - Proceed with colonoscopy. please see report for                         further recommendations.                        - The findings and recommendations were discussed with                         the patient.                        - The findings and recommendations were discussed with                         the patient's family. Procedure Code(s):     --- Professional ---                        [phone], Esophagogastroduodenoscopy, flexible,                         transoral; with biopsy, single or multiple Diagnosis Code(s):     --- Professional ---                        K22.70, Barrett's esophagus without dysplasia                        K29.70, Gastritis, unspecified, without bleeding CPT  copyright 2022 American Medical Association. All rights reserved. The codes documented in this report are preliminary and upon coder review may  be revised to meet current compliance requirements. Attending Participation:      I personally performed the entire procedure. Elfredia Nevins, DO Jaynie Collins DO, DO 10/31/2023 9:13:23 AM This report has been signed electronically. Number of Addenda: 0 Note Initiated On: 10/31/2023 8:25 AM Estimated Blood Loss:  Estimated blood loss was minimal.      [facility]

## 2023-10-31 NOTE — Transfer of Care (Signed)
 Immediate Anesthesia Transfer of Care Note  Patient: Eddie Clark  Procedure(s) Performed: COLONOSCOPY EGD (ESOPHAGOGASTRODUODENOSCOPY)  Patient Location: PACU  Anesthesia Type:MAC  Level of Consciousness: sedated  Airway & Oxygen Therapy: Patient Spontanous Breathing  Post-op Assessment: Report given to RN and Post -op Vital signs reviewed and stable  Post vital signs: stable  Last Vitals:  Vitals Value Taken Time  BP 103/61 10/31/23 1004  Temp 36.1 C 10/31/23 1004  Pulse 93 10/31/23 1005  Resp 16 10/31/23 1005  SpO2 96 % 10/31/23 1005  Vitals shown include unfiled device data.  Last Pain:  Vitals:   10/31/23 1004  TempSrc: Temporal  PainSc: Asleep         Complications: No notable events documented.

## 2023-10-31 NOTE — Interval H&P Note (Signed)
 History and Physical Interval Note: Preprocedure H&P from 10/31/23  was reviewed and there was no interval change after seeing and examining the patient.  Written consent was obtained from the patient after discussion of risks, benefits, and alternatives. Patient has consented to proceed with Esophagogastroduodenoscopy and Colonoscopy with possible intervention   10/31/2023 8:47 AM  Eddie Clark  has presented today for surgery, with the diagnosis of Abnormal CT scan, colon (R93.3) Hematochezia (K92.1) Anemia, unspecified type (D64.9) Hx of adenomatous colonic polyps (Z86.0101) FH: colon cancer (Z80.0).  The various methods of treatment have been discussed with the patient and family. After consideration of risks, benefits and other options for treatment, the patient has consented to  Procedure(s): COLONOSCOPY (N/A) EGD (ESOPHAGOGASTRODUODENOSCOPY) (N/A) as a surgical intervention.  The patient's history has been reviewed, patient examined, no change in status, stable for surgery.  I have reviewed the patient's chart and labs.  Questions were answered to the patient's satisfaction.     Jaynie Collins
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
## 2023-10-31 NOTE — Op Note (Signed)
 [facility] Patient Name: Eddie Clark Procedure Date: 10/31/2023 8:21 AM MRN: [id number] Account #: [id number] Date of Birth: [birth year] Admit Type: Outpatient Age: 53 Room: [facility] ENDO ROOM 2 Gender: Male Note Status: Supervisor Override Instrument Name: Peds Colonoscope [id number] Procedure:             Colonoscopy Indications:           Hematochezia, Iron deficiency anemia, Abnormal CT of                         the GI tract Providers:             Trenda Moots, DO Referring MD:          Danella Penton, MD (Referring MD) Medicines:             Monitored Anesthesia Care Complications:         No immediate complications. Estimated blood loss:                         Minimal. Procedure:             Pre-Anesthesia Assessment:                        - Prior to the procedure, a History and Physical was                         performed, and patient medications and allergies were                         reviewed. The patient is competent. The risks and                         benefits of the procedure and the sedation options and                         risks were discussed with the patient. All questions                         were answered and informed consent was obtained.                         Patient identification and proposed procedure were                         verified by the physician, the nurse, the anesthetist                         and the technician in the endoscopy suite. Mental                         Status Examination: alert and oriented. Airway                         Examination: normal oropharyngeal airway and neck                         mobility. Respiratory Examination: clear to  auscultation. CV Examination: RRR, no murmurs, no S3                         or S4. Prophylactic Antibiotics: The patient does not                         require prophylactic antibiotics. Prior                          Anticoagulants: The patient has taken no anticoagulant                         or antiplatelet agents. ASA Grade Assessment: II - A                         patient with mild systemic disease. After reviewing                         the risks and benefits, the patient was deemed in                         satisfactory condition to undergo the procedure. The                         anesthesia plan was to use monitored anesthesia care                         (MAC). Immediately prior to administration of                         medications, the patient was re-assessed for adequacy                         to receive sedatives. The heart rate, respiratory                         rate, oxygen saturations, blood pressure, adequacy of                         pulmonary ventilation, and response to care were                         monitored throughout the procedure. The physical                         status of the patient was re-assessed after the                         procedure.                        After obtaining informed consent, the colonoscope was                         passed under direct vision. Throughout the procedure,                         the patient's blood pressure, pulse, and oxygen  saturations were monitored continuously. The                         Colonoscope was introduced through the anus and                         advanced to the the terminal ileum, with                         identification of the appendiceal orifice and IC                         valve. The colonoscopy was performed without                         difficulty. The patient tolerated the procedure well.                         The quality of the bowel preparation was evaluated                         using the BBPS [facility] Bowel Preparation Scale) with                         scores of: Right Colon = 1 (portion of mucosa seen,                         but other areas not well  seen due to staining,                         residual stool and/or opaque liquid), Transverse Colon                         = 2 (minor amount of residual staining, small                         fragments of stool and/or opaque liquid, but mucosa                         seen well) and Left Colon = 2 (minor amount of                         residual staining, small fragments of stool and/or                         opaque liquid, but mucosa seen well). The total BBPS                         score equals 5. The quality of the bowel preparation                         was inadequate. The terminal ileum, ileocecal valve,                         appendiceal orifice, and rectum were photographed. Findings:      Hemorrhoids were found on perianal exam.      The digital rectal exam was  normal. Pertinent negatives include normal       sphincter tone.      The terminal ileum appeared normal. Biopsies were taken with a cold       forceps for histology. Estimated blood loss was minimal.      Retroflexion in the right colon was performed.      A large amount of stool was found in the ascending colon and in the       cecum, interfering with visualization. Lavage of the area was performed       using a large amount, resulting in clearance with fair visualization. A       significant amount of time was spent lavaging this area given the stool       and the findings on recent CT. No lesion was appreciated at the IC valve       or inside the terminal ileum      A 12 to 14 mm polyp was found in the cecum. The polyp was sessile. Area       was successfully injected with 1 mL Eleview for lesion assessment, and       this injection appeared to lift the lesion adequately. Imaging was       performed using white light and narrow band imaging to visualize the       mucosa. To assess lesion margins prior to polypectomy. The polyp was       removed with a hot snare. Resection and retrieval were complete. To        prevent bleeding after the polypectomy, one hemostatic clip was       successfully placed (MR conditional). There was no bleeding at the end       of the procedure. Estimated blood loss was minimal. [employer]       Resolution 360 clip; Catalog number [id number]      Non-bleeding external and internal hemorrhoids were found during       retroflexion and during perianal exam. Estimated blood loss: none. Impression:            - Preparation of the colon was inadequate.                        - Hemorrhoids found on perianal exam.                        - The examined portion of the ileum was normal.                         Biopsied.                        - Stool in the ascending colon and in the cecum.                        - One 12 to 14 mm polyp in the cecum, removed with a                         hot snare. Resected and retrieved. Injected. Clip (MR                         conditional) was placed.                        -  Non-bleeding external and internal hemorrhoids. Recommendation:        - Patient has a contact number available for                         emergencies. The signs and symptoms of potential                         delayed complications were discussed with the patient.                         Return to normal activities tomorrow. Written                         discharge instructions were provided to the patient.                        - Discharge patient to home.                        - Resume previous diet.                        - Continue present medications.                        - No ibuprofen, naproxen, or other non-steroidal                         anti-inflammatory drugs for 5 days after polyp removal.                        - Would start medication for constipation such as                         linzess or amitiza.                        Will plan for MRI to evaluate areas of interest on                         liver.                        - Await  pathology results.                        - Repeat colonoscopy in 3 months because the bowel                         preparation was poor.                        - Return to GI office at appointment to be scheduled.                        - The findings and recommendations were discussed with                         the patient.                        -  The findings and recommendations were discussed with                         the patient's family. Procedure Code(s):     --- Professional ---                        [phone], Colonoscopy, flexible; with removal of                         tumor(s), polyp(s), or other lesion(s) by snare                         technique                        45380, 59, Colonoscopy, flexible; with biopsy, single                         or multiple                        45381, Colonoscopy, flexible; with directed submucosal                         injection(s), any substance Diagnosis Code(s):     --- Professional ---                        K64.8, Other hemorrhoids                        D12.0, Benign neoplasm of cecum                        K92.1, Melena (includes Hematochezia)                        R93.3, Abnormal findings on diagnostic imaging of                         other parts of digestive tract CPT copyright 2022 American Medical Association. All rights reserved. The codes documented in this report are preliminary and upon coder review may  be revised to meet current compliance requirements. Attending Participation:      I personally performed the entire procedure. Elfredia Nevins, DO Jaynie Collins DO, DO 10/31/2023 10:09:11 AM This report has been signed electronically. Number of Addenda: 0 Note Initiated On: 10/31/2023 8:21 AM Scope Withdrawal Time: 0 hours 36 minutes 56 seconds  Total Procedure Duration: 0 hours 42 minutes 44 seconds  Estimated Blood Loss:  Estimated blood loss was minimal.      [facility]
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
## 2023-11-01 LAB — SURGICAL PATHOLOGY

## 2023-11-01 NOTE — Anesthesia Postprocedure Evaluation (Signed)
 Anesthesia Post Note  Patient: Eddie Clark  Procedure(s) Performed: COLONOSCOPY EGD (ESOPHAGOGASTRODUODENOSCOPY) CONTROL OF HEMORRHAGE, GI TRACT, ENDOSCOPIC, BY CLIPPING OR OVERSEWING INJECTION, SUBMUCOSAL POLYPECTOMY  Patient location during evaluation: Endoscopy Anesthesia Type: General Level of consciousness: awake and alert Pain management: pain level controlled Vital Signs Assessment: post-procedure vital signs reviewed and stable Respiratory status: spontaneous breathing, nonlabored ventilation and respiratory function stable Cardiovascular status: blood pressure returned to baseline and stable Postop Assessment: no apparent nausea or vomiting Anesthetic complications: no   No notable events documented.   Last Vitals:  Vitals:   10/31/23 1034 10/31/23 1044  BP: 129/86 129/87  Pulse: 77 70  Resp: 18 17  Temp:    SpO2: 100% 98%    Last Pain:  Vitals:   10/31/23 1044  TempSrc:   PainSc: 0-No pain                 Foye Deer

## 2023-11-02 ENCOUNTER — Other Ambulatory Visit: Payer: Self-pay | Admitting: Gastroenterology

## 2023-11-02 DIAGNOSIS — R9389 Abnormal findings on diagnostic imaging of other specified body structures: Secondary | ICD-10-CM

## 2023-11-02 DIAGNOSIS — K921 Melena: Secondary | ICD-10-CM

## 2023-12-31 ENCOUNTER — Ambulatory Visit
Admission: RE | Admit: 2023-12-31 | Discharge: 2023-12-31 | Disposition: A | Discharge status: Home / Self Care | Source: Ambulatory Visit | Attending: Gastroenterology | Admitting: Gastroenterology

## 2023-12-31 DIAGNOSIS — R9389 Abnormal findings on diagnostic imaging of other specified body structures: Secondary | ICD-10-CM

## 2023-12-31 DIAGNOSIS — K921 Melena: Secondary | ICD-10-CM

## 2023-12-31 MED ORDER — GADOPICLENOL 0.5 MMOL/ML IV SOLN
10.0000 mL | Freq: Once | INTRAVENOUS | Status: AC | PRN
  Administered 2023-12-31: 10 mL via INTRAVENOUS

## 2024-01-17 ENCOUNTER — Encounter: Payer: Self-pay | Admitting: Gastroenterology

## 2024-02-03 ENCOUNTER — Ambulatory Visit: Admit: 2024-02-03 | Admitting: Gastroenterology

## 2024-02-03 HISTORY — DX: Glaucoma secondary to other eye disorders, left eye, indeterminate stage: H40.52X4

## 2024-02-03 HISTORY — DX: Corneal transplant status: Z94.7

## 2024-02-03 SURGERY — COLONOSCOPY
Anesthesia: General

## 2024-02-24 ENCOUNTER — Other Ambulatory Visit

## 2024-02-24 DIAGNOSIS — E291 Testicular hypofunction: Secondary | ICD-10-CM

## 2024-02-25 LAB — COMPREHENSIVE METABOLIC PANEL WITH GFR
ALT: 30 IU/L (ref 0–44)
AST: 28 IU/L (ref 0–40)
Albumin: 4.3 g/dL (ref 3.8–4.9)
Alkaline Phosphatase: 41 IU/L — ABNORMAL LOW (ref 44–121)
BUN/Creatinine Ratio: 16 (ref 9–20)
BUN: 17 mg/dL (ref 6–24)
Bilirubin Total: 0.3 mg/dL (ref 0.0–1.2)
CO2: 21 mmol/L (ref 20–29)
Calcium: 9 mg/dL (ref 8.7–10.2)
Chloride: 102 mmol/L (ref 96–106)
Creatinine, Ser: 1.07 mg/dL (ref 0.76–1.27)
Globulin, Total: 2.8 g/dL (ref 1.5–4.5)
Glucose: 119 mg/dL — ABNORMAL HIGH (ref 70–99)
Potassium: 4.8 mmol/L (ref 3.5–5.2)
Sodium: 139 mmol/L (ref 134–144)
Total Protein: 7.1 g/dL (ref 6.0–8.5)
eGFR: 84 mL/min/1.73 (ref >59)

## 2024-02-25 LAB — CBC WITH DIFFERENTIAL/PLATELET
Basophils Absolute: 0 x10E3/uL (ref 0.0–0.2)
Basos: 1 %
EOS (ABSOLUTE): 0.1 x10E3/uL (ref 0.0–0.4)
Eos: 1 %
Hematocrit: 43.3 % (ref 37.5–51.0)
Hemoglobin: 13.3 g/dL (ref 13.0–17.7)
Immature Grans (Abs): 0 x10E3/uL (ref 0.0–0.1)
Immature Granulocytes: 0 %
Lymphocytes Absolute: 1.2 x10E3/uL (ref 0.7–3.1)
Lymphs: 35 %
MCH: 26.4 pg — ABNORMAL LOW (ref 26.6–33.0)
MCHC: 30.7 g/dL — ABNORMAL LOW (ref 31.5–35.7)
MCV: 86 fL (ref 79–97)
Monocytes Absolute: 0.4 x10E3/uL (ref 0.1–0.9)
Monocytes: 12 %
Neutrophils Absolute: 1.8 x10E3/uL (ref 1.4–7.0)
Neutrophils: 51 %
Platelets: 194 x10E3/uL (ref 150–450)
RBC: 5.03 x10E6/uL (ref 4.14–5.80)
RDW: 17.2 % — ABNORMAL HIGH (ref 11.6–15.4)
WBC: 3.5 x10E3/uL (ref 3.4–10.8)

## 2024-02-25 LAB — PSA: Prostate Specific Ag, Serum: 0.3 ng/mL (ref 0.0–4.0)

## 2024-02-25 LAB — TESTOSTERONE: Testosterone: 357 ng/dL (ref 264–916)

## 2024-03-06 ENCOUNTER — Telehealth: Payer: Self-pay

## 2024-03-06 MED ORDER — TESTOSTERONE CYPIONATE 200 MG/ML IM SOLN: INTRAMUSCULAR | 0 refills | Status: DC

## 2024-03-06 NOTE — Telephone Encounter (Signed)
 Patient called in to have testosterone  switched to [facility] on [address] from [employer]. Looks like patient is out of refills as well. Will pend and send to provider

## 2024-03-08 ENCOUNTER — Other Ambulatory Visit: Payer: No Typology Code available for payment source

## 2024-03-08 NOTE — Telephone Encounter (Signed)
 Patient aware.

## 2024-03-26 ENCOUNTER — Other Ambulatory Visit: Payer: Self-pay

## 2024-03-26 NOTE — Telephone Encounter (Signed)
 Pt calling in to see status of refill for testosterone . Refill was placed on 7/29 with a change of pharmacy. Checking on status to see if Rx went through.

## 2024-03-27 ENCOUNTER — Other Ambulatory Visit: Payer: Self-pay

## 2024-03-27 DIAGNOSIS — E291 Testicular hypofunction: Secondary | ICD-10-CM

## 2024-03-27 MED ORDER — TADALAFIL 10 MG PO TABS
10.0000 mg | ORAL_TABLET | Freq: Every day | ORAL | 0 refills | Status: DC | PRN
Start: 2024-03-27 — End: 2024-03-28

## 2024-03-28 ENCOUNTER — Other Ambulatory Visit: Payer: Self-pay

## 2024-03-28 DIAGNOSIS — E291 Testicular hypofunction: Secondary | ICD-10-CM

## 2024-03-28 MED ORDER — TADALAFIL 10 MG PO TABS: 10.0000 mg | ORAL_TABLET | Freq: Every day | ORAL | 0 refills | Status: DC | PRN

## 2024-03-28 NOTE — Telephone Encounter (Signed)
 Patient called back about his Tadalifil and do to price at [facility] he has requested that we send his Rx [facility] where he can get it cheaper.

## 2024-04-05 NOTE — Telephone Encounter (Signed)
 Hey Lyn,  This pt is having a hard time with his testosterone  refill.  He has contacted the pharmacy and they tell him they faxed over a request to us .  When he calls us , we tell him we have already sent something over.  He is frustrated and doesn't know what to do next.  Can you help?

## 2024-04-05 NOTE — Telephone Encounter (Signed)
 Talked with [facility] and they state they have a [phone] number that he needs to call his insurance,. I talked with Eddie Clark today and he is going to call and see what the next step needs to be done. He has our fax number to give them, If we need to do anything the insurance will contact us  or him .

## 2024-04-19 ENCOUNTER — Telehealth: Payer: Self-pay | Admitting: *Deleted

## 2024-04-19 ENCOUNTER — Other Ambulatory Visit: Payer: Self-pay

## 2024-04-19 MED ORDER — TESTOSTERONE CYPIONATE 200 MG/ML IM SOLN: INTRAMUSCULAR | 0 refills | Status: DC

## 2024-04-19 NOTE — Addendum Note (Signed)
 Addended by: GENITA HARLENE CROME on: 04/19/2024 10:49 AM   Modules accepted: Orders

## 2024-04-19 NOTE — Telephone Encounter (Signed)
 Called  [phone] we talked about the FDA label was checked wrong . Advised there is no contraindication
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
## 2024-04-21 ENCOUNTER — Ambulatory Visit
Admission: EM | Admit: 2024-04-21 | Discharge: 2024-04-21 | Disposition: A | Discharge status: Home / Self Care | Attending: Emergency Medicine | Admitting: Emergency Medicine

## 2024-04-21 ENCOUNTER — Encounter: Payer: Self-pay | Admitting: Emergency Medicine

## 2024-04-21 DIAGNOSIS — J0110 Acute frontal sinusitis, unspecified: Secondary | ICD-10-CM | POA: Diagnosis not present

## 2024-04-21 LAB — POC SOFIA SARS ANTIGEN FIA: SARS Coronavirus 2 Ag: NEGATIVE

## 2024-04-21 MED ORDER — HYDROCOD POLI-CHLORPHE POLI ER 10-8 MG/5ML PO SUER: 5.0000 mL | Freq: Every evening | ORAL | 0 refills | Status: DC | PRN

## 2024-04-21 MED ORDER — AMOXICILLIN-POT CLAVULANATE 875-125 MG PO TABS
1.0000 | ORAL_TABLET | Freq: Two times a day (BID) | ORAL | 0 refills | Status: DC
Start: 2024-04-21 — End: 2024-06-11
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
## 2024-04-21 NOTE — Discharge Instructions (Signed)
 Begin Augmentin  twice daily for 7 days for treatment of a sinus infection  May use cough syrup at bedtime as needed for rest    You can take Tylenol  and/or Ibuprofen as needed for fever reduction and pain relief.   For cough: honey 1/2 to 1 teaspoon (you can dilute the honey in water or another fluid).  You can also use guaifenesin and dextromethorphan for cough. You can use a humidifier for chest congestion and cough.  If you don't have a humidifier, you can sit in the bathroom with the hot shower running.      For sore throat: try warm salt water gargles, cepacol lozenges, throat spray, warm tea or water with lemon/honey, popsicles or ice, or OTC cold relief medicine for throat discomfort.   For congestion: take a daily anti-histamine like Zyrtec, Claritin, and a oral decongestant, such as pseudoephedrine.  You can also use Flonase 1-2 sprays in each nostril daily.   It is important to stay hydrated: drink plenty of fluids (water, gatorade/powerade/pedialyte, juices, or teas) to keep your throat moisturized and help further relieve irritation/discomfort.

## 2024-04-21 NOTE — ED Triage Notes (Signed)
 Pt presents with cough, headache, nasal congestion, left ear pain and sore throat x 2 days

## 2024-06-11 ENCOUNTER — Encounter: Payer: Self-pay | Admitting: *Deleted

## 2024-06-11 ENCOUNTER — Ambulatory Visit
Admission: EM | Admit: 2024-06-11 | Discharge: 2024-06-11 | Disposition: A | Discharge status: Home / Self Care | Attending: Emergency Medicine | Admitting: Emergency Medicine

## 2024-06-11 DIAGNOSIS — B349 Viral infection, unspecified: Secondary | ICD-10-CM

## 2024-06-11 LAB — POCT RAPID STREP A (OFFICE): Rapid Strep A Screen: NEGATIVE

## 2024-06-11 MED ORDER — AMOXICILLIN-POT CLAVULANATE 875-125 MG PO TABS: 1.0000 | ORAL_TABLET | Freq: Two times a day (BID) | ORAL | 0 refills | Status: DC

## 2024-06-11 MED ORDER — HYDROCOD POLI-CHLORPHE POLI ER 10-8 MG/5ML PO SUER: 5.0000 mL | Freq: Every evening | ORAL | 0 refills | Status: DC | PRN

## 2024-06-11 NOTE — Discharge Instructions (Addendum)
 Your symptoms today are most likely being caused by a virus and should steadily improve in time it can take up to 7 to 10 days before you truly start to see a turnaround however things will get better  Rapid strep test is negative  Due to your history of reoccurring sinusitis you have been empirically placed on antibiotic to keep symptoms from worsening, take Augmentin  twice daily for 7 days  Starting tomorrow take prednisone as directed, if you do not have a full course of medicine please notify the clinic  You may use cough syrup at bedtime as needed for comfort      You can take Tylenol  and/or Ibuprofen as needed for fever reduction and pain relief.   For cough: honey 1/2 to 1 teaspoon (you can dilute the honey in water or another fluid).  You can also use guaifenesin and dextromethorphan for cough. You can use a humidifier for chest congestion and cough.  If you don't have a humidifier, you can sit in the bathroom with the hot shower running.      For sore throat: try warm salt water gargles, cepacol lozenges, throat spray, warm tea or water with lemon/honey, popsicles or ice, or OTC cold relief medicine for throat discomfort.   For congestion: take a daily anti-histamine like Zyrtec, Claritin, and a oral decongestant, such as pseudoephedrine.  You can also use Flonase 1-2 sprays in each nostril daily.   It is important to stay hydrated: drink plenty of fluids (water, gatorade/powerade/pedialyte, juices, or teas) to keep your throat moisturized and help further relieve irritation/discomfort.

## 2024-06-11 NOTE — ED Triage Notes (Signed)
 Patient works in healthcare, started having cough and sore throat on Sat and symptoms worsening.  Taking OTC pain meds with some relief.
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
## 2024-07-13 ENCOUNTER — Other Ambulatory Visit: Payer: Self-pay

## 2024-07-13 DIAGNOSIS — E291 Testicular hypofunction: Secondary | ICD-10-CM

## 2024-07-13 MED ORDER — TADALAFIL 10 MG PO TABS: 10.0000 mg | ORAL_TABLET | Freq: Every day | ORAL | 2 refills | Status: AC | PRN

## 2024-07-24 ENCOUNTER — Ambulatory Visit
Admission: EM | Admit: 2024-07-24 | Discharge: 2024-07-24 | Disposition: A | Discharge status: Home / Self Care | Attending: Emergency Medicine | Admitting: Emergency Medicine

## 2024-07-24 ENCOUNTER — Encounter: Payer: Self-pay | Admitting: Emergency Medicine

## 2024-07-24 DIAGNOSIS — J0100 Acute maxillary sinusitis, unspecified: Secondary | ICD-10-CM

## 2024-07-24 MED ORDER — AMOXICILLIN-POT CLAVULANATE 875-125 MG PO TABS: 1.0000 | ORAL_TABLET | Freq: Two times a day (BID) | ORAL | 0 refills | Status: DC

## 2024-07-24 NOTE — ED Notes (Signed)
 Patient triage by provider Teresa Shelba SAUNDERS, NP
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
## 2024-07-24 NOTE — Discharge Instructions (Signed)
 Begin Augmentin  twice daily for 7 days for treatment of bacteria contributing to the sinuses    You can take Tylenol  and/or Ibuprofen as needed for fever reduction and pain relief.   For cough: honey 1/2 to 1 teaspoon (you can dilute the honey in water or another fluid).  You can also use guaifenesin and dextromethorphan for cough. You can use a humidifier for chest congestion and cough.  If you don't have a humidifier, you can sit in the bathroom with the hot shower running.      For sore throat: try warm salt water gargles, cepacol lozenges, throat spray, warm tea or water with lemon/honey, popsicles or ice, or OTC cold relief medicine for throat discomfort.   For congestion: take a daily anti-histamine like Zyrtec, Claritin, and a oral decongestant, such as pseudoephedrine.  You can also use Flonase 1-2 sprays in each nostril daily.   It is important to stay hydrated: drink plenty of fluids (water, gatorade/powerade/pedialyte, juices, or teas) to keep your throat moisturized and help further relieve irritation/discomfort.  Email: [email or web address]

## 2024-08-20 ENCOUNTER — Other Ambulatory Visit: Payer: Self-pay

## 2024-08-20 DIAGNOSIS — N138 Other obstructive and reflux uropathy: Secondary | ICD-10-CM

## 2024-08-21 ENCOUNTER — Ambulatory Visit
Admission: EM | Admit: 2024-08-21 | Discharge: 2024-08-21 | Disposition: A | Discharge status: Home / Self Care | Attending: Emergency Medicine | Admitting: Emergency Medicine

## 2024-08-21 ENCOUNTER — Encounter: Payer: Self-pay | Admitting: Emergency Medicine

## 2024-08-21 DIAGNOSIS — B9789 Other viral agents as the cause of diseases classified elsewhere: Secondary | ICD-10-CM

## 2024-08-21 DIAGNOSIS — J988 Other specified respiratory disorders: Secondary | ICD-10-CM | POA: Diagnosis not present

## 2024-08-21 MED ORDER — ALBUTEROL SULFATE HFA 108 (90 BASE) MCG/ACT IN AERS: 1.0000 | INHALATION_SPRAY | RESPIRATORY_TRACT | 0 refills | Status: AC | PRN

## 2024-08-21 MED ORDER — AEROCHAMBER MV MISC: 1 refills | Status: AC

## 2024-08-21 MED ORDER — HYDROCOD POLI-CHLORPHE POLI ER 10-8 MG/5ML PO SUER: 5.0000 mL | Freq: Two times a day (BID) | ORAL | 0 refills | Status: AC | PRN

## 2024-08-21 MED ORDER — PREDNISONE 20 MG PO TABS: 40.0000 mg | ORAL_TABLET | Freq: Every day | ORAL | 0 refills | Status: AC

## 2024-08-21 MED ORDER — FLUTICASONE PROPIONATE 50 MCG/ACT NA SUSP: 2.0000 | Freq: Every day | NASAL | 0 refills | Status: AC

## 2024-08-21 MED ORDER — CEFDINIR 300 MG PO CAPS: 300.0000 mg | ORAL_CAPSULE | Freq: Two times a day (BID) | ORAL | 0 refills | Status: AC
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
## 2024-08-21 NOTE — ED Triage Notes (Signed)
 Patient reports sore throat, nasal congestion, cough , and hoarseness x 2 days. Patient has taken Aleve/ Dayquil on yesterday with no relief. Patient has not taken any medication today for symptoms. Rates sore throat 3/10.

## 2024-08-21 NOTE — Discharge Instructions (Signed)
 It was nice meeting you.  I hope you feel better soon.  Start Mucinex-D to keep the mucous thin and to decongest you.   You may take 440 mg of Aleve with 1000 mg of tylenol  up to 2 times a day as needed for pain. This is an effective combination for pain.  Most sinus infections are viral and do not need antibiotics unless you have a high fever, have had this for 10 days, or you get better and then get sick again. Use a NeilMed sinus rinse with distilled water as often as you want to to reduce nasal congestion. Follow the directions on the box.  Flonase .  Tussionex for cough.  Finish prednisone , even if you feel better.  2 puffs from an albuterol  inhaler with a spacer every 4-6 hours as needed for coughing, wheezing.  Wait-and-see prescription of Omnicef .  Go to www.goodrx.com to look up your medications. This will give you a list of where you can find your prescriptions at the most affordable prices. Or you can ask the pharmacist what the cash price is. This is frequently cheaper than going through insurance.

## 2024-08-29 ENCOUNTER — Encounter: Payer: Self-pay | Admitting: Urology

## 2024-08-30 ENCOUNTER — Other Ambulatory Visit: Payer: Self-pay | Admitting: *Deleted

## 2024-08-30 DIAGNOSIS — N401 Enlarged prostate with lower urinary tract symptoms: Secondary | ICD-10-CM

## 2024-08-30 NOTE — Telephone Encounter (Signed)
 Added.

## 2024-08-31 ENCOUNTER — Other Ambulatory Visit: Payer: No Typology Code available for payment source

## 2024-08-31 ENCOUNTER — Other Ambulatory Visit

## 2024-08-31 ENCOUNTER — Other Ambulatory Visit: Payer: Self-pay | Admitting: Physician Assistant

## 2024-08-31 DIAGNOSIS — E291 Testicular hypofunction: Secondary | ICD-10-CM

## 2024-08-31 DIAGNOSIS — N138 Other obstructive and reflux uropathy: Secondary | ICD-10-CM

## 2024-09-05 ENCOUNTER — Ambulatory Visit: Payer: No Typology Code available for payment source | Admitting: Urology

## 2024-09-05 LAB — COMPREHENSIVE METABOLIC PANEL WITH GFR
ALT: 26 [IU]/L (ref 0–44)
AST: 22 [IU]/L (ref 0–40)
Albumin: 4.1 g/dL (ref 3.8–4.9)
Alkaline Phosphatase: 44 [IU]/L — ABNORMAL LOW (ref 47–123)
BUN/Creatinine Ratio: 22 — ABNORMAL HIGH (ref 9–20)
BUN: 22 mg/dL (ref 6–24)
Bilirubin Total: 0.3 mg/dL (ref 0.0–1.2)
CO2: 21 mmol/L (ref 20–29)
Calcium: 8.8 mg/dL (ref 8.7–10.2)
Chloride: 105 mmol/L (ref 96–106)
Creatinine, Ser: 1 mg/dL (ref 0.76–1.27)
Globulin, Total: 2.6 g/dL (ref 1.5–4.5)
Glucose: 109 mg/dL — ABNORMAL HIGH (ref 70–99)
Potassium: 4.6 mmol/L (ref 3.5–5.2)
Sodium: 140 mmol/L (ref 134–144)
Total Protein: 6.7 g/dL (ref 6.0–8.5)
eGFR: 91 mL/min/{1.73_m2}

## 2024-09-05 LAB — CBC WITH DIFFERENTIAL/PLATELET

## 2024-09-05 LAB — TESTOSTERONE: Testosterone: 222 ng/dL — ABNORMAL LOW (ref 264–916)

## 2024-09-05 LAB — PSA: Prostate Specific Ag, Serum: 0.4 ng/mL (ref 0.0–4.0)

## 2024-09-14 ENCOUNTER — Encounter: Payer: Self-pay | Admitting: Urology

## 2024-09-14 ENCOUNTER — Ambulatory Visit: Admitting: Urology

## 2024-09-14 VITALS — BP 142/90 | HR 92 | Ht 74.0 in | Wt 270.0 lb

## 2024-09-14 DIAGNOSIS — E291 Testicular hypofunction: Secondary | ICD-10-CM
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
## 2024-09-14 NOTE — Progress Notes (Unsigned)
 Patient presents for an office visit. BP today is High. Greater than 140/90. Provider  notified and recheck Blood Pressure 142 90  .  Pt advised to talk with PCP.  Pt voiced understanding.

## 2024-09-19 ENCOUNTER — Ambulatory Visit: Admitting: Urology

## 2025-03-15 ENCOUNTER — Other Ambulatory Visit

## 2025-09-27 ENCOUNTER — Other Ambulatory Visit: Admitting: Urology

## 2025-10-04 ENCOUNTER — Ambulatory Visit: Admitting: Urology
